# Patient Record
Sex: Female | Born: 1965
Health system: Southern US, Community
[De-identification: ages and names within clinical notes are randomized; demographics above are authoritative.]

## PROBLEM LIST (undated history)

## (undated) DIAGNOSIS — C569 Malignant neoplasm of unspecified ovary: Secondary | ICD-10-CM

## (undated) DIAGNOSIS — Z803 Family history of malignant neoplasm of breast: Secondary | ICD-10-CM

## (undated) DIAGNOSIS — G43909 Migraine, unspecified, not intractable, without status migrainosus: Secondary | ICD-10-CM

## (undated) HISTORY — DX: Family history of malignant neoplasm of breast: Z80.3

## (undated) HISTORY — PX: TOTAL ABDOMINAL HYSTERECTOMY W/ BILATERAL SALPINGOOPHORECTOMY: SHX83

## (undated) HISTORY — DX: Malignant neoplasm of unspecified ovary: C56.9

## (undated) HISTORY — DX: Migraine, unspecified, not intractable, without status migrainosus: G43.909

---

## 2016-08-19 ENCOUNTER — Encounter: Payer: Self-pay | Admitting: Certified Nurse Midwife

## 2016-08-19 ENCOUNTER — Ambulatory Visit: Payer: Self-pay | Admitting: Certified Nurse Midwife

## 2016-08-19 ENCOUNTER — Telehealth: Payer: Self-pay | Admitting: *Deleted

## 2016-08-19 VITALS — BP 112/74 | HR 68 | Resp 16 | Ht 62.25 in | Wt 132.0 lb

## 2016-08-19 DIAGNOSIS — N852 Hypertrophy of uterus: Secondary | ICD-10-CM | POA: Diagnosis not present

## 2016-08-19 DIAGNOSIS — N898 Other specified noninflammatory disorders of vagina: Secondary | ICD-10-CM

## 2016-08-19 DIAGNOSIS — B373 Candidiasis of vulva and vagina: Secondary | ICD-10-CM

## 2016-08-19 DIAGNOSIS — B3731 Acute candidiasis of vulva and vagina: Secondary | ICD-10-CM

## 2016-08-19 LAB — POCT URINALYSIS DIPSTICK
Bilirubin, UA: NEGATIVE
Blood, UA: NEGATIVE
Glucose, UA: NEGATIVE
Ketones, UA: NEGATIVE
LEUKOCYTES UA: NEGATIVE
NITRITE UA: NEGATIVE
PH UA: 5
PROTEIN UA: NEGATIVE
Urobilinogen, UA: NEGATIVE

## 2016-08-19 MED ORDER — TERCONAZOLE 0.4 % VA CREA: 1.0000 | TOPICAL_CREAM | Freq: Every day | VAGINAL | 0 refills | Status: DC

## 2016-08-19 NOTE — Patient Instructions (Signed)

## 2016-08-19 NOTE — Progress Notes (Signed)
51 y.o. Married [place] female G2P0 here with complaint of vaginal symptoms of increase vaginal discharge only in the morning and no odor noted. No itching. Describes discharge as white, noted before and after period, continues today. Daughter with her for translating and agreeable to presence for exam and management. Declines translator or phone line. Onset of symptoms 3 days ago. Denies new personal products or vaginal dryness.Periods normal, monthly. Not heavy, with 5 day duration. No  STD concerns. Urinary symptoms none . Contraception is abstinence  ROS Pertinient to HPI  O:Healthy female WDWN Affect: normal, orientation x 3  Exam:Skin warm and dry Abdomen:soft, non tender  Inguinal Lymph nodes: no enlargement or tenderness Pelvic exam: External genital: normal female BUS: negative Vagina: white cottage cheese appearance non odorous discharge noted. Ph:  4.0  ,Wet prep taken,  Cervix: normal, non tender, no CMT Uterus: enlarged 10-12 week size, non tender, tilts to left Adnexa:normal, non tender, no masses or fullness noted Left adnexa hard to palpate due to uterus tilts to left   Wet Prep results: +yeast with KOH,Saline    A:Normal pelvic exam Yeast vaginitis Enlarged Uterus, not symptomatic   P:Discussed findings of yeast vaginitis and etiology. Discussed  baking soda sitz bath for comfort. Avoid moist clothes or pads for extended period of time Rx: Terazol 7 see order with instructions Discussed uterine findings and need for evaluation. Possible etiology fibroid, adenomyosis or mass. Will need PUS evaluation. Patient wants aex with pap smear and to do PUS same day. Will be leaving the country to care for sick father in 3 weeks. Discussed will try to work with her with this. She will be called with insurance infor and PUS scheduled same as aex scheduled.  Rv prn

## 2016-08-19 NOTE — Telephone Encounter (Signed)
Spoke with Melvia Heaps, CNM- please schedule patient for PUS with Dr. Talbert Nan for enlarged uterus on same day as AEX 08/27/16.  Patient scheduled for PUS 08/27/16 at 2:30pm with 2:45pm consult with Dr. Talbert Nan.   Call to patient to notify of PUS, no answer.   Cc: Dr. Talbert Nan, Lerry Liner

## 2016-08-20 NOTE — Telephone Encounter (Signed)
Spoke with patient's daughter Laura Washington at time of incoming call, okay per ROI. Laura Washington is calling to verify appointment for patient's PUS. Advised appointment has been scheduled for PUS on 08/27/2016 at 2:30 pm with 2:45 pm consult with Dr.Jertson. She will have aex to follow at 3:15 pm with Melvia Heaps CNM (times per [name], RN). Laura Washington is agreeable and will advise patient of appointment date and times. Order has been placed for precert.  Cc: Dr.Jertson  Routing to provider for final review. Patient agreeable to disposition. Will close encounter.

## 2016-08-22 NOTE — Progress Notes (Signed)
Encounter reviewed Janai Brannigan, MD   

## 2016-08-24 ENCOUNTER — Telehealth: Payer: Self-pay | Admitting: Obstetrics and Gynecology

## 2016-08-24 NOTE — Telephone Encounter (Signed)
Called patient to review benefits for a recommended ultrasound. Left Voicemail requesting a call back. °

## 2016-08-27 ENCOUNTER — Encounter: Payer: Self-pay | Admitting: Obstetrics and Gynecology

## 2016-08-27 ENCOUNTER — Encounter: Payer: BLUE CROSS/BLUE SHIELD | Admitting: Certified Nurse Midwife

## 2016-08-27 ENCOUNTER — Encounter: Payer: Self-pay | Admitting: *Deleted

## 2016-08-27 ENCOUNTER — Ambulatory Visit: Payer: BLUE CROSS/BLUE SHIELD | Admitting: Certified Nurse Midwife

## 2016-08-27 ENCOUNTER — Encounter: Payer: Self-pay | Admitting: Certified Nurse Midwife

## 2016-08-27 ENCOUNTER — Ambulatory Visit (INDEPENDENT_AMBULATORY_CARE_PROVIDER_SITE_OTHER): Payer: BLUE CROSS/BLUE SHIELD

## 2016-08-27 ENCOUNTER — Other Ambulatory Visit: Payer: Self-pay

## 2016-08-27 ENCOUNTER — Ambulatory Visit (INDEPENDENT_AMBULATORY_CARE_PROVIDER_SITE_OTHER): Payer: BLUE CROSS/BLUE SHIELD | Admitting: Obstetrics and Gynecology

## 2016-08-27 VITALS — BP 120/68 | HR 88 | Resp 14 | Wt 132.0 lb

## 2016-08-27 DIAGNOSIS — N949 Unspecified condition associated with female genital organs and menstrual cycle: Secondary | ICD-10-CM | POA: Diagnosis not present

## 2016-08-27 DIAGNOSIS — N9489 Other specified conditions associated with female genital organs and menstrual cycle: Secondary | ICD-10-CM

## 2016-08-27 DIAGNOSIS — N852 Hypertrophy of uterus: Secondary | ICD-10-CM | POA: Diagnosis not present

## 2016-08-27 NOTE — Progress Notes (Signed)
Referral placed to [facility]. Demographics, insurance information, OV note and PUS from 08/27/2016 faxed with cover sheet and confirmation to [phone]. Scheduled patient while in office for appointment with Dr.Brewster at [facility] [address], [place] [address] for first available appointment on 09/04/2016 at 9:45 am. Patient is agreeable to date and time.

## 2016-08-27 NOTE — Progress Notes (Signed)
GYNECOLOGY  VISIT   HPI: 51 y.o.   Married  [place]  female  She is here with an interpreter and a family friend.  G2P0 with Patient's last menstrual period was 08/09/2016.   here for follow up enlarged uterus. The patient was seen by Ms Hollice Espy last week with c/o vaginal discharge and was noted to have an "enlarged uterus". She reports normal monthly cycles. Denies abdominal pain or bowel issues. "Feels fine".  GYNECOLOGIC HISTORY: Patient's last menstrual period was 08/09/2016. Contraception: None Menopausal hormone therapy: none         OB History    Gravida Para Term Preterm AB Living   2         2   SAB TAB Ectopic Multiple Live Births                     There are no active problems to display for this patient.   Past Medical History:  Diagnosis Date  . Migraines     No past surgical history on file.  Current Outpatient Prescriptions  Medication Sig Dispense Refill  . Cholecalciferol (VITAMIN D) 2000 units tablet Take one tablet twice daily.    . Ibuprofen (ADVIL PO) Take by mouth as needed.     No current facility-administered medications for this visit.      ALLERGIES: Hydrocodone-acetaminophen  No family history on file.  Social History   Social History  . Marital status: Married    Spouse name: N/A  . Number of children: N/A  . Years of education: N/A   Occupational History  . Not on file.   Social History Main Topics  . Smoking status: Never Smoker  . Smokeless tobacco: Never Used  . Alcohol use No  . Drug use: No  . Sexual activity: Yes    Partners: Male    Birth control/ protection: None   Other Topics Concern  . Not on file   Social History Narrative  . No narrative on file    Review of Systems  Constitutional: Negative.   HENT: Negative.   Eyes: Negative.   Respiratory: Negative.   Cardiovascular: Negative.   Gastrointestinal: Negative.   Genitourinary:       Vaginal discharge  Musculoskeletal: Negative.   Skin: Negative.    Neurological: Negative.   Endo/Heme/Allergies: Negative.   Psychiatric/Behavioral: Negative.     PHYSICAL EXAMINATION:    BP 120/68 (BP Location: Right Arm, Patient Position: Sitting, Cuff Size: Normal)   Pulse 88   Resp 14   LMP 08/09/2016     Abdomen: soft, non-tender; no masses, mildly distended.   Ultrasound images reviewed: The patient's ultrasound is significant for a 7.4 x 6.4 x 6.6 cm solid mass in the left adnexa with irregular borders and blood flow. The Pulsatile index is <1 and the resistive index is <0.4. There is a large amount of pelvic fluid. Concerning for possible ovarian cancer.   ASSESSMENT Complex left adnexal mass concerning for possible maliganacy    PLAN CA 125 Will set her up to see a GYN Oncologist The patient is supposed to leave next week for a 5 week trip to [place]. I advised her not to travel, I feel she needs to get evaluated and treated   An After Visit Summary was printed and given to the patient.  20 minutes face to face time of which over 50% was spent in counseling.

## 2016-08-27 NOTE — Progress Notes (Signed)
Pt had u/s done before visit with abnormal findings. Annual exam defferred at this time. Pt to have follow up with Dr Talbert Nan with results and management to follow.

## 2016-08-28 ENCOUNTER — Telehealth: Payer: Self-pay | Admitting: *Deleted

## 2016-08-28 LAB — CA 125: CA 125: 1677 U/mL — AB (ref <35)

## 2016-08-28 NOTE — Telephone Encounter (Signed)
-----   Message from Salvadore Dom, MD sent at 08/28/2016  3:12 PM EST ----- Please inform the patient that her blood test was abnormal, which is not unexpected. Please send a copy to the GYN oncology clinic.  The patient doesn't speak English well, there are other names on her DPR that do.

## 2016-08-28 NOTE — Telephone Encounter (Signed)
ROI reviewed. Can leave message on [phone]. This is also daughter's number and can release info to daughter. Left message to call back to Dr Gentry Fitz office. Ask for Gay Filler or [place].

## 2016-08-28 NOTE — Telephone Encounter (Signed)
Return call from patient's daughter Sumeer. Demographics confirmed and she is able to provide clinical summary of visit yesterday visit.  Advised we are calling to notify Jaydee of results of blood test yesterday are abnormal and it is important to keep appointment for next week as scheduled. Sumeer asked if this provided any other information, advised that the abnormal result is consistent with ultrasound concerns from yesterday and follow-up appointment is needed to determine next step. Daughter states they will keep appointment.  Encounter closed.

## 2016-09-17 ENCOUNTER — Other Ambulatory Visit: Payer: Self-pay | Admitting: Gynecologic Oncology

## 2016-09-17 DIAGNOSIS — C5702 Malignant neoplasm of left fallopian tube: Secondary | ICD-10-CM

## 2016-09-23 ENCOUNTER — Encounter: Payer: Self-pay | Admitting: Hematology and Oncology

## 2016-09-23 ENCOUNTER — Ambulatory Visit (HOSPITAL_BASED_OUTPATIENT_CLINIC_OR_DEPARTMENT_OTHER): Payer: BLUE CROSS/BLUE SHIELD | Admitting: Hematology and Oncology

## 2016-09-23 DIAGNOSIS — G8929 Other chronic pain: Secondary | ICD-10-CM

## 2016-09-23 DIAGNOSIS — M25552 Pain in left hip: Secondary | ICD-10-CM

## 2016-09-23 DIAGNOSIS — C5702 Malignant neoplasm of left fallopian tube: Secondary | ICD-10-CM | POA: Diagnosis not present

## 2016-09-25 DIAGNOSIS — M25552 Pain in left hip: Secondary | ICD-10-CM

## 2016-09-25 DIAGNOSIS — G8929 Other chronic pain: Secondary | ICD-10-CM | POA: Insufficient documentation

## 2016-09-25 NOTE — Assessment & Plan Note (Signed)
She had chronic left hip pain and has been referred to orthopedic surgery for management. The left pain actually has improved somewhat since surgery. I suspect she may have referred pain from her pelvis at the time of diagnosis. She will continue conservative management

## 2016-09-25 NOTE — Progress Notes (Signed)
[facility] CONSULT NOTE  Patient Care Team: No Pcp Per Patient as PCP - General (General Practice)  CHIEF COMPLAINTS/PURPOSE OF CONSULTATION:  Fallopian cancer status post surgery  HISTORY OF PRESENTING ILLNESS:  Laura Washington 51 y.o. female is here because of recent diagnosis of cancer. An interpreter is present. A family friend who identified as a close kin is also present She is a patient who was recently diagnosed with fallopian cancer.  Her symptoms began with left hip pain.  She saw her primary doctor who referred her to see orthopedic surgeon.  She also presented with chronic menorrhagia for over a year and subsequently had new onset of vaginal discharge after she was started on some medications for arthritis.  I have reviewed her chart extensively as the patient is seen in multiple health system.  Summary of her oncologic history is as follows:   Cancer of left fallopian tube ([place])   07/28/2016 Initial Diagnosis    She presented to PCP complaining of heavy menstruation for almost 1 year      08/27/2016 Imaging    She was referred to see Dr. Talbert Nan after she started to have vaginal discharge. Pelvic US showed: 7.4 x 6.4 x 6.6 cm solid mass in the left adnexa with irregular borders and blood flow. The Pulsatile index is <1 and the resistive index is <0.4. There is a large amount of pelvic fluid. Concerning for possible ovarian cancer.        08/27/2016 Tumor Marker    Patient's tumor was tested for the following markers: CA125. Results of the tumor marker test revealed 1577      09/09/2016 Pathology Results    Primary Tumor Site(s):Left fallopian tube Tumor Size:Greatest dimension (cm): 3.5 cm Histologic Type:Serous high-grade carcinoma Histologic Grade:G3: Poorly differentiated  Ovarian Surface Involvement:Present  Specimen(s):Right ovary  Right Ovary - Extent:Not involved  Specimen(s):Left ovary  Left  Ovary - Extent:Involved  Specimen(s):Right fallopian tube  Right Fallopian Tube - Extent:Not involved  Specimen(s):Left fallopian tube  Left Fallopian Tube - Extent:Involved  Specimen(s):Omentum  Omentum - Extent:Not involved  Specimen(s):Uterus  Uterus - Extent:Not involved  Specimen(s):Cervix  Cervix - Extent:Not involved  Specimen(s):Peritoneum  Peritoneum - Extent:Not involved  Peritoneal Ascitic Fluid:Negative for malignancy (normal / benign)  Pleural Fluid:Not performed / unknown  LYMPH NODES  Pelvic Lymph Nodes:  Number of Pelvic Lymph Nodes Examined:Specify number: 17  Number of Pelvic Lymph Nodes Involved:None identified  Para-aortic Lymph Nodes:  Number of Para-aortic Lymph Nodes Examined:Specify number: 2  Number of Para-aortic Lymph Nodes Involved:None identified  STAGE (pTNM, AJCC 7th ed.)  Primary Tumor (pT):Fallopian tube  Fallopian Tube Primary Tumor (pT):pT2a: Extension and / or metastasis to the uterus and / or ovaries  Regional Lymph Nodes (pN):pN0: No regional lymph node metastasis  Distant Metastasis (pM):Not applicable - pM cannot be determined from the submitted specimen(s)  FIGO STAGE (FIGO 2014)  FIGO Stage:IIA: Extension and / or implants on the uterus and / or fallopian tubes and / or ovaries      09/09/2016 Pathology Results    Pelvic fluid, washing: No definitive malignant cells identified. Blood, histiocytes, and reactive mesothelial cells present. Mixed inflammation present. See comment.      09/09/2016 Surgery    Procedures: Bilateral - Robotic Laparoscopy, Surgical; With Bilateral Total Pelvic Lymphadenectomy Peri-Aortic Lymph Node Sample, Sgl/Mult Robotic Bilateral Salpingo-Oophorectomy W/Omentectomy, Total Abd Hysterectomy & Radical Dissection  For Debulking;    Findings:  1. 9-10 cm left adnexal mass with rupture  of tumor during manipulation 2. Dilated left fallopian tube removed in tact with IOFS: poorly differentiated carcinoma 3. Endometriosis implants on anterior and posterior cul-de-sac 4. Grossly normal omentum, diaphragm, small bowel mesenter 5. Grossly normal uterus and right fallopian tube/ovary 6. IOFS: anterior peritoneal biopsy (endometriosis) // left fallopian tube: poorly differentiated carcinoma 7. 1-2 cm tumor nodule in posterior cul-de-sac; completely resected 8. Non-enlarged lymph nodes 9. R0 resection at end of case 10. Ascites present      She is doing well since surgery.  She has mild incisional pain and occasional headache.  She also have mild gassy/crampy abdominal pain but had regular bowel movement since surgery. She had very mild oozing from her surgery site but since then has healed well She denies further vaginal discharge since surgery. The left hip pain has improved   MEDICAL HISTORY:  Past Medical History:  Diagnosis Date  . Migraines   . Ovarian cancer [facility])     SURGICAL HISTORY: Past Surgical History:  Procedure Laterality Date  . TOTAL ABDOMINAL HYSTERECTOMY W/ BILATERAL SALPINGOOPHORECTOMY      SOCIAL HISTORY: Social History   Social History  . Marital status: Married    Spouse name: N/A  . Number of children: 2  . Years of education: N/A   Occupational History  . packing socks    Social History Main Topics  . Smoking status: Never Smoker  . Smokeless tobacco: Never Used  . Alcohol use No  . Drug use: No  . Sexual activity: Yes    Partners: Male    Birth control/ protection: None   Other Topics Concern  . Not on file   Social History Narrative  . No narrative on file    FAMILY HISTORY: History reviewed. No pertinent family history.  ALLERGIES:  is allergic to hydrocodone-acetaminophen.  MEDICATIONS:  Current Outpatient Prescriptions  Medication Sig  Dispense Refill  . Ibuprofen (ADVIL PO) Take by mouth as needed.    . simethicone (MYLICON) 175 MG chewable tablet Chew 125 mg by mouth every 6 (six) hours as needed for flatulence.    . Cholecalciferol (VITAMIN D) 2000 units tablet Take one tablet twice daily.    Marland Kitchen docusate sodium (COLACE) 100 MG capsule Take 100 mg by mouth.     No current facility-administered medications for this visit.     REVIEW OF SYSTEMS:   Constitutional: Denies fevers, chills or abnormal night sweats Eyes: Denies blurriness of vision, double vision or watery eyes Ears, nose, mouth, throat, and face: Denies mucositis or sore throat Respiratory: Denies cough, dyspnea or wheezes Cardiovascular: Denies palpitation, chest discomfort or lower extremity swelling Gastrointestinal:  Denies nausea, heartburn or change in bowel habits Skin: Denies abnormal skin rashes Lymphatics: Denies new lymphadenopathy or easy bruising Neurological:Denies numbness, tingling or new weaknesses Behavioral/Psych: Mood is stable, no new changes  All other systems were reviewed with the patient and are negative.  PHYSICAL EXAMINATION: ECOG PERFORMANCE STATUS: 1 - Symptomatic but completely ambulatory  Vitals:   09/23/16 1351  BP: (!) 146/79  Pulse: (!) 108  Resp: 16  Temp: 97.8 F (36.6 C)   Filed Weights   09/23/16 1351  Weight: 123 lb 4.8 oz (55.9 kg)    GENERAL:alert, no distress and comfortable SKIN: skin color, texture, turgor are normal, no rashes or significant lesions EYES: normal, conjunctiva are pink and non-injected, sclera clear OROPHARYNX:no exudate, no erythema and lips, buccal mucosa, and tongue normal  NECK: supple, thyroid normal size, non-tender, without nodularity LYMPH:  no  palpable lymphadenopathy in the cervical, axillary or inguinal LUNGS: clear to auscultation and percussion with normal breathing effort HEART: regular rate & rhythm and no murmurs and no lower extremity edema ABDOMEN:abdomen soft,  non-tender and normal bowel sounds.  Well-healed surgical scar Musculoskeletal:no cyanosis of digits and no clubbing  PSYCH: alert & oriented x 3 with fluent speech NEURO: no focal motor/sensory deficits  LABORATORY DATA:  I have reviewed the outside labs  ASSESSMENT & PLAN:  Cancer of left fallopian tube ([place]) I spent a significant amount of time discussing with the patient and family the role of adjuvant treatment. I also reviewed the current guidelines. Given the high-grade pathology, it is highly recommend that she receive adjuvant treatment to prevent recurrence of cancer. We discussed the risks, benefits, side effects of adjuvant treatment with carboplatin and paclitaxel, including risk of pancytopenia, allergic reaction, risk of infection, neuropathy, etc. We also discussed about potential port placement I also discussed baseline staging CT scan before we proceed with chemotherapy The patient has a lot of reservations about chemotherapy.  She felt weak overall and is very sensitive to side effects of medications. She did not feel that she can tolerate it. I tried to reassure the patient and family.  She is young without major significant medical comorbidities.  I suspect she would do well with treatment. Her family member desire her to obtain second opinion. I discussed with them the collaborated practice between [facility] and [facility].  She has a son who lives near [place] area and they have consider second opinion at [facility]. In addition to adjuvant treatment, given her young age at diagnosis, I also recommend consideration for genetic counseling. Ultimately, she has appointment at the end of the week to discuss further with her surgeon about the role of adjuvant treatment. I have not made appointment for the patient to come back  Chronic hip pain, left She had chronic left hip pain and has been referred to orthopedic surgery for  management. The left pain actually has improved somewhat since surgery. I suspect she may have referred pain from her pelvis at the time of diagnosis. She will continue conservative management    All questions were answered. The patient knows to call the clinic with any problems, questions or concerns. I spent 55 minutes counseling the patient face to face. The total time spent in the appointment was 60 minutes and more than 50% was on counseling.     Heath Lark, MD 09/25/2016 6:11 AM

## 2016-09-25 NOTE — Assessment & Plan Note (Addendum)
I spent a significant amount of time discussing with the patient and family the role of adjuvant treatment. I also reviewed the current guidelines. Given the high-grade pathology, it is highly recommend that she receive adjuvant treatment to prevent recurrence of cancer. We discussed the risks, benefits, side effects of adjuvant treatment with carboplatin and paclitaxel, including risk of pancytopenia, allergic reaction, risk of infection, neuropathy, etc. We also discussed about potential port placement I also discussed baseline staging CT scan before we proceed with chemotherapy The patient has a lot of reservations about chemotherapy.  She felt weak overall and is very sensitive to side effects of medications. She did not feel that she can tolerate it. I tried to reassure the patient and family.  She is young without major significant medical comorbidities.  I suspect she would do well with treatment. Her family member desire her to obtain second opinion. I discussed with them the collaborated practice between [facility] and [facility].  She has a son who lives near [place] area and they have consider second opinion at [facility]. In addition to adjuvant treatment, given her young age at diagnosis, I also recommend consideration for genetic counseling. Ultimately, she has appointment at the end of the week to discuss further with her surgeon about the role of adjuvant treatment. I have not made appointment for the patient to come back

## 2016-11-09 ENCOUNTER — Telehealth: Payer: Self-pay

## 2016-11-09 NOTE — Telephone Encounter (Signed)
I will put scheduling msg to see her back next week

## 2016-11-09 NOTE — Telephone Encounter (Signed)
Son called and left message that his Mom needs a appointment with Dr. Alvy Bimler. Because of the language barrier it would be best to call him regarding appointment at [phone].

## 2016-11-11 ENCOUNTER — Telehealth: Payer: Self-pay | Admitting: Hematology and Oncology

## 2016-11-11 NOTE — Telephone Encounter (Signed)
lvm to inform pt of 5/9 appt at 3 pm per LOS

## 2016-11-18 ENCOUNTER — Ambulatory Visit (HOSPITAL_BASED_OUTPATIENT_CLINIC_OR_DEPARTMENT_OTHER): Payer: BLUE CROSS/BLUE SHIELD | Admitting: Hematology and Oncology

## 2016-11-18 ENCOUNTER — Telehealth: Payer: Self-pay

## 2016-11-18 ENCOUNTER — Telehealth: Payer: Self-pay | Admitting: Hematology and Oncology

## 2016-11-18 ENCOUNTER — Encounter: Payer: Self-pay | Admitting: Hematology and Oncology

## 2016-11-18 VITALS — BP 140/70 | HR 105 | Temp 97.7°F | Resp 18 | Ht 62.25 in | Wt 123.5 lb

## 2016-11-18 DIAGNOSIS — C5702 Malignant neoplasm of left fallopian tube: Secondary | ICD-10-CM

## 2016-11-18 NOTE — Assessment & Plan Note (Signed)
I spent a significant amount of time discussing with the patient and family the role of adjuvant treatment. I also reviewed the current guidelines. Given the high-grade pathology, it is highly recommend that she receive adjuvant treatment to prevent recurrence of cancer. We discussed the risks, benefits, side effects of adjuvant treatment with carboplatin and paclitaxel, including risk of pancytopenia, allergic reaction, risk of infection, neuropathy, etc. We also discussed about potential port placement I also discussed baseline staging CT scan before we proceed with chemotherapy We also discussed genetic counseling and genetic testing ultimately, she is in agreement to proceed with adjuvant treatment. I will order CT scan, chemo education class, referral for genetic counseling/testing, blood work, see me back around Dec 02, 2016 for chemotherapy consent and to start cycle 1 on 12/04/2016. She would need total of 6 cycles of treatment. I recommend consideration to apply for disability during treatment I will see her back again to review all test results.

## 2016-11-18 NOTE — Telephone Encounter (Signed)
-----   Message from Loletta Parish sent at 11/18/2016  4:06 PM EDT ----- Regarding: RE: Ct scan to be done in 2 weeks Authorizations Approved.  Thanks  ----- Message ----- From: Heath Lark, MD Sent: 11/18/2016   3:36 PM To: Flo Shanks, RN, Loletta Parish Subject: Ct scan to be done in 2 weeks                  FYI: need CT done

## 2016-11-18 NOTE — Progress Notes (Signed)
[facility] OFFICE PROGRESS NOTE  Patient Care Team: Patient, No Pcp Per as PCP - General (General Practice)  SUMMARY OF ONCOLOGIC HISTORY:   Cancer of left fallopian tube ([place])   07/28/2016 Initial Diagnosis    She presented to PCP complaining of heavy menstruation for almost 1 year      08/27/2016 Imaging    She was referred to see Dr. Talbert Nan after she started to have vaginal discharge. Pelvic US showed: 7.4 x 6.4 x 6.6 cm solid mass in the left adnexa with irregular borders and blood flow. The Pulsatile index is <1 and the resistive index is <0.4. There is a large amount of pelvic fluid. Concerning for possible ovarian cancer.        08/27/2016 Tumor Marker    Patient's tumor was tested for the following markers: CA125. Results of the tumor marker test revealed 1577      09/09/2016 Pathology Results    Primary Tumor Site(s):Left fallopian tube Tumor Size:Greatest dimension (cm): 3.5 cm Histologic Type:Serous high-grade carcinoma Histologic Grade:G3: Poorly differentiated  Ovarian Surface Involvement:Present  Specimen(s):Right ovary  Right Ovary - Extent:Not involved  Specimen(s):Left ovary  Left Ovary - Extent:Involved  Specimen(s):Right fallopian tube  Right Fallopian Tube - Extent:Not involved  Specimen(s):Left fallopian tube  Left Fallopian Tube - Extent:Involved  Specimen(s):Omentum  Omentum - Extent:Not involved  Specimen(s):Uterus  Uterus - Extent:Not involved  Specimen(s):Cervix  Cervix - Extent:Not involved  Specimen(s):Peritoneum  Peritoneum - Extent:Not involved  Peritoneal Ascitic Fluid:Negative for malignancy (normal / benign)  Pleural Fluid:Not performed / unknown  LYMPH NODES  Pelvic Lymph Nodes:  Number of Pelvic Lymph Nodes Examined:Specify  number: 17  Number of Pelvic Lymph Nodes Involved:None identified  Para-aortic Lymph Nodes:  Number of Para-aortic Lymph Nodes Examined:Specify number: 2  Number of Para-aortic Lymph Nodes Involved:None identified  STAGE (pTNM, AJCC 7th ed.)  Primary Tumor (pT):Fallopian tube  Fallopian Tube Primary Tumor (pT):pT2a: Extension and / or metastasis to the uterus and / or ovaries  Regional Lymph Nodes (pN):pN0: No regional lymph node metastasis  Distant Metastasis (pM):Not applicable - pM cannot be determined from the submitted specimen(s)  FIGO STAGE (FIGO 2014)  FIGO Stage:IIA: Extension and / or implants on the uterus and / or fallopian tubes and / or ovaries      09/09/2016 Pathology Results    Pelvic fluid, washing: No definitive malignant cells identified. Blood, histiocytes, and reactive mesothelial cells present. Mixed inflammation present. See comment.      09/09/2016 Surgery    Procedures: Bilateral - Robotic Laparoscopy, Surgical; With Bilateral Total Pelvic Lymphadenectomy Peri-Aortic Lymph Node Sample, Sgl/Mult Robotic Bilateral Salpingo-Oophorectomy W/Omentectomy, Total Abd Hysterectomy & Radical Dissection For Debulking;    Findings:  1. 9-10 cm left adnexal mass with rupture of tumor during manipulation 2. Dilated left fallopian tube removed in tact with IOFS: poorly differentiated carcinoma 3. Endometriosis implants on anterior and posterior cul-de-sac 4. Grossly normal omentum, diaphragm, small bowel mesenter 5. Grossly normal uterus and right fallopian tube/ovary 6. IOFS: anterior peritoneal biopsy (endometriosis) // left fallopian tube: poorly differentiated carcinoma 7. 1-2 cm tumor nodule in posterior cul-de-sac; completely resected 8. Non-enlarged lymph nodes 9. R0 resection at end of case 10. Ascites present       INTERVAL HISTORY: Please see below for problem oriented charting. She returns with  her son today. Her son has convinced her to start adjuvant treatment. Her son has acted as an Astronomer during the visit She is recovering well from recent surgery.  She continues to have mild hip pain, stable.  REVIEW OF SYSTEMS:   Constitutional: Denies fevers, chills or abnormal weight loss Eyes: Denies blurriness of vision Ears, nose, mouth, throat, and face: Denies mucositis or sore throat Respiratory: Denies cough, dyspnea or wheezes Cardiovascular: Denies palpitation, chest discomfort or lower extremity swelling Gastrointestinal:  Denies nausea, heartburn or change in bowel habits Skin: Denies abnormal skin rashes Lymphatics: Denies new lymphadenopathy or easy bruising Neurological:Denies numbness, tingling or new weaknesses Behavioral/Psych: Mood is stable, no new changes  All other systems were reviewed with the patient and are negative.  I have reviewed the past medical history, past surgical history, social history and family history with the patient and they are unchanged from previous note.  ALLERGIES:  is allergic to hydrocodone-acetaminophen.  MEDICATIONS:  Current Outpatient Prescriptions  Medication Sig Dispense Refill  . Cholecalciferol (VITAMIN D) 2000 units tablet Take one tablet twice daily.    . Ibuprofen (ADVIL PO) Take by mouth as needed.    . simethicone (MYLICON) 062 MG chewable tablet Chew 125 mg by mouth every 6 (six) hours as needed for flatulence.     No current facility-administered medications for this visit.     PHYSICAL EXAMINATION: ECOG PERFORMANCE STATUS: 0 - Asymptomatic  Vitals:   11/18/16 1501  BP: 140/70  Pulse: (!) 105  Resp: 18  Temp: 97.7 F (36.5 C)   Filed Weights   11/18/16 1501  Weight: 123 lb 8 oz (56 kg)    GENERAL:alert, no distress and comfortable SKIN: skin color, texture, turgor are normal, no rashes or significant lesions EYES: normal, Conjunctiva are pink and non-injected, sclera clear OROPHARYNX:no exudate,  no erythema and lips, buccal mucosa, and tongue normal  NECK: supple, thyroid normal size, non-tender, without nodularity LYMPH:  no palpable lymphadenopathy in the cervical, axillary or inguinal LUNGS: clear to auscultation and percussion with normal breathing effort HEART: regular rate & rhythm and no murmurs and no lower extremity edema ABDOMEN:abdomen soft, non-tender and normal bowel sounds Musculoskeletal:no cyanosis of digits and no clubbing  NEURO: alert & oriented x 3 with fluent speech, no focal motor/sensory deficits  LABORATORY DATA:  I have reviewed the data as listed No results found for: NA, K, CL, CO2, GLUCOSE, BUN, CREATININE, CALCIUM, PROT, ALBUMIN, AST, ALT, ALKPHOS, BILITOT, GFRNONAA, GFRAA  No results found for: SPEP, UPEP  No results found for: WBC, NEUTROABS, HGB, HCT, MCV, PLT    Chemistry   No results found for: NA, K, CL, CO2, BUN, CREATININE, GLU No results found for: CALCIUM, ALKPHOS, AST, ALT, BILITOT     ASSESSMENT & PLAN:  Cancer of left fallopian tube ([facility]) I spent a significant amount of time discussing with the patient and family the role of adjuvant treatment. I also reviewed the current guidelines. Given the high-grade pathology, it is highly recommend that she receive adjuvant treatment to prevent recurrence of cancer. We discussed the risks, benefits, side effects of adjuvant treatment with carboplatin and paclitaxel, including risk of pancytopenia, allergic reaction, risk of infection, neuropathy, etc. We also discussed about potential port placement I also discussed baseline staging CT scan before we proceed with chemotherapy We also discussed genetic counseling and genetic testing ultimately, she is in agreement to proceed with adjuvant treatment. I will order CT scan, chemo education class, referral for genetic counseling/testing, blood work, see me back around Dec 02, 2016 for chemotherapy consent and to start cycle 1 on 12/04/2016. She would  need total of 6 cycles of treatment. I  recommend consideration to apply for disability during treatment I will see her back again to review all test results.   Orders Placed This Encounter  Procedures  . CT CHEST W CONTRAST    Standing Status:   Future    Standing Expiration Date:   01/18/2018    Order Specific Question:   Reason for Exam (SYMPTOM  OR DIAGNOSIS REQUIRED)    Answer:   staging fallopian tube cancer, exclude recurrence    Order Specific Question:   Is the patient pregnant?    Answer:   No    Order Specific Question:   Preferred imaging location?    Answer:   [facility]  . CT ABDOMEN PELVIS W CONTRAST    Standing Status:   Future    Standing Expiration Date:   02/18/2018    Order Specific Question:   Reason for Exam (SYMPTOM  OR DIAGNOSIS REQUIRED)    Answer:   staging fallopian tube cancer, exclude recurrence    Order Specific Question:   Is the patient pregnant?    Answer:   No    Order Specific Question:   Preferred imaging location?    Answer:   [facility]  . IR FLUORO GUIDE PORT INSERTION RIGHT    Standing Status:   Future    Standing Expiration Date:   01/18/2018    Order Specific Question:   Reason for Exam (SYMPTOM  OR DIAGNOSIS REQUIRED)    Answer:   need port for chemo    Order Specific Question:   Is the patient pregnant?    Answer:   No    Order Specific Question:   Preferred Imaging Location?    Answer:   [facility]  . CBC with Differential    Standing Status:   Standing    Number of Occurrences:   20    Standing Expiration Date:   11/19/2017  . Comprehensive metabolic panel    Standing Status:   Standing    Number of Occurrences:   20    Standing Expiration Date:   11/19/2017  . CA 125    Standing Status:   Future    Standing Expiration Date:   12/23/2017  . Ambulatory referral to Genetics    Referral Priority:   Routine    Referral Type:   Consultation    Referral Reason:   Specialty Services Required    Number of  Visits Requested:   1   All questions were answered. The patient knows to call the clinic with any problems, questions or concerns. No barriers to learning was detected. I spent 40 minutes counseling the patient face to face. The total time spent in the appointment was 45 minutes and more than 50% was on counseling and review of test results     Heath Lark, MD 11/18/2016 3:54 PM

## 2016-11-18 NOTE — Telephone Encounter (Signed)
Called and given below message. Scheduler will call patient.

## 2016-11-18 NOTE — Telephone Encounter (Signed)
Scheduled appt per 5/9 los. Gave patient  AVS and calender per 5/9 los.

## 2016-11-18 NOTE — Progress Notes (Signed)
START ON PATHWAY REGIMEN - Ovarian     A cycle is every 21 days:     Paclitaxel      Carboplatin   **Always confirm dose/schedule in your pharmacy ordering system**    Patient Characteristics: First Line Therapy, No Previous Neoadjuvant Therapy, Stage IIA AJCC T Category: T2 AJCC N Category: N0 AJCC M Category: M0 AJCC 8 Stage Grouping: II Line of Therapy: First Line Therapy BRCA Mutation Status: Awaiting Test Results Would you be surprised if this patient died  in the next year? I would be surprised if this patient died in the next year Previous Neoadjuvant Therapy? No  Intent of Therapy: Curative Intent, Discussed with Patient

## 2016-11-29 ENCOUNTER — Other Ambulatory Visit: Payer: Self-pay | Admitting: Radiology

## 2016-11-30 ENCOUNTER — Ambulatory Visit (HOSPITAL_COMMUNITY)
Admission: RE | Admit: 2016-11-30 | Discharge: 2016-11-30 | Disposition: A | Discharge status: Home / Self Care | Payer: BLUE CROSS/BLUE SHIELD | Source: Ambulatory Visit | Attending: Hematology and Oncology | Admitting: Hematology and Oncology

## 2016-11-30 ENCOUNTER — Encounter (HOSPITAL_COMMUNITY): Payer: Self-pay

## 2016-11-30 DIAGNOSIS — Z90722 Acquired absence of ovaries, bilateral: Secondary | ICD-10-CM | POA: Insufficient documentation

## 2016-11-30 DIAGNOSIS — Z90710 Acquired absence of both cervix and uterus: Secondary | ICD-10-CM | POA: Insufficient documentation

## 2016-11-30 DIAGNOSIS — C5702 Malignant neoplasm of left fallopian tube: Secondary | ICD-10-CM | POA: Diagnosis present

## 2016-11-30 DIAGNOSIS — Z803 Family history of malignant neoplasm of breast: Secondary | ICD-10-CM | POA: Diagnosis not present

## 2016-11-30 DIAGNOSIS — Z885 Allergy status to narcotic agent status: Secondary | ICD-10-CM | POA: Diagnosis not present

## 2016-11-30 DIAGNOSIS — Z8249 Family history of ischemic heart disease and other diseases of the circulatory system: Secondary | ICD-10-CM | POA: Diagnosis not present

## 2016-11-30 MED ORDER — IOPAMIDOL (ISOVUE-300) INJECTION 61%
INTRAVENOUS | Status: AC
  Filled 2016-11-30: qty 100

## 2016-11-30 MED ORDER — IOPAMIDOL (ISOVUE-300) INJECTION 61%
100.0000 mL | Freq: Once | INTRAVENOUS | Status: AC | PRN
  Administered 2016-11-30: 100 mL via INTRAVENOUS

## 2016-12-01 ENCOUNTER — Ambulatory Visit (HOSPITAL_COMMUNITY)
Admission: RE | Admit: 2016-12-01 | Discharge: 2016-12-01 | Disposition: A | Discharge status: Home / Self Care | Payer: BLUE CROSS/BLUE SHIELD | Source: Ambulatory Visit | Attending: Hematology and Oncology | Admitting: Hematology and Oncology

## 2016-12-01 ENCOUNTER — Ambulatory Visit (HOSPITAL_BASED_OUTPATIENT_CLINIC_OR_DEPARTMENT_OTHER): Payer: BLUE CROSS/BLUE SHIELD | Admitting: Genetic Counselor

## 2016-12-01 ENCOUNTER — Encounter (HOSPITAL_COMMUNITY): Payer: Self-pay

## 2016-12-01 ENCOUNTER — Other Ambulatory Visit: Payer: Self-pay | Admitting: Hematology and Oncology

## 2016-12-01 ENCOUNTER — Encounter: Payer: Self-pay | Admitting: Genetic Counselor

## 2016-12-01 ENCOUNTER — Other Ambulatory Visit (HOSPITAL_BASED_OUTPATIENT_CLINIC_OR_DEPARTMENT_OTHER): Payer: BLUE CROSS/BLUE SHIELD

## 2016-12-01 DIAGNOSIS — C5702 Malignant neoplasm of left fallopian tube: Secondary | ICD-10-CM

## 2016-12-01 DIAGNOSIS — Z803 Family history of malignant neoplasm of breast: Secondary | ICD-10-CM | POA: Diagnosis not present

## 2016-12-01 DIAGNOSIS — Z7183 Encounter for nonprocreative genetic counseling: Secondary | ICD-10-CM

## 2016-12-01 HISTORY — PX: IR US GUIDE VASC ACCESS RIGHT: IMG2390

## 2016-12-01 HISTORY — PX: IR FLUORO GUIDE PORT INSERTION RIGHT: IMG5741

## 2016-12-01 LAB — BASIC METABOLIC PANEL
Anion gap: 11 (ref 5–15)
BUN: 8 mg/dL (ref 6–20)
CHLORIDE: 103 mmol/L (ref 101–111)
CO2: 26 mmol/L (ref 22–32)
CREATININE: 0.76 mg/dL (ref 0.44–1.00)
Calcium: 9.5 mg/dL (ref 8.9–10.3)
Glucose, Bld: 104 mg/dL — ABNORMAL HIGH (ref 65–99)
Potassium: 3.6 mmol/L (ref 3.5–5.1)
Sodium: 140 mmol/L (ref 135–145)

## 2016-12-01 LAB — CBC WITH DIFFERENTIAL/PLATELET
Basophils Absolute: 0 10*3/uL (ref 0.0–0.1)
Basophils Relative: 0 %
Eosinophils Absolute: 0 10*3/uL (ref 0.0–0.7)
Eosinophils Relative: 0 %
HCT: 33.3 % — ABNORMAL LOW (ref 36.0–46.0)
Hemoglobin: 9.7 g/dL — ABNORMAL LOW (ref 12.0–15.0)
Lymphocytes Relative: 24 %
Lymphs Abs: 1.4 10*3/uL (ref 0.7–4.0)
MCH: 20.9 pg — AB (ref 26.0–34.0)
MCHC: 29.1 g/dL — ABNORMAL LOW (ref 30.0–36.0)
MCV: 71.6 fL — ABNORMAL LOW (ref 78.0–100.0)
MONO ABS: 0.2 10*3/uL (ref 0.1–1.0)
Monocytes Relative: 3 %
NEUTROS PCT: 73 %
Neutro Abs: 4.2 10*3/uL (ref 1.7–7.7)
PLATELETS: 321 10*3/uL (ref 150–400)
RBC: 4.65 MIL/uL (ref 3.87–5.11)
RDW: 16.1 % — AB (ref 11.5–15.5)
WBC: 5.8 10*3/uL (ref 4.0–10.5)

## 2016-12-01 LAB — PROTIME-INR
INR: 0.99
Prothrombin Time: 13.1 seconds (ref 11.4–15.2)

## 2016-12-01 MED ORDER — HEPARIN SOD (PORK) LOCK FLUSH 100 UNIT/ML IV SOLN
INTRAVENOUS | Status: AC
  Filled 2016-12-01: qty 5

## 2016-12-01 MED ORDER — CEFAZOLIN SODIUM-DEXTROSE 2-4 GM/100ML-% IV SOLN
INTRAVENOUS | Status: AC
  Filled 2016-12-01: qty 100

## 2016-12-01 MED ORDER — FENTANYL CITRATE (PF) 100 MCG/2ML IJ SOLN
INTRAMUSCULAR | Status: AC
  Filled 2016-12-01: qty 8

## 2016-12-01 MED ORDER — FENTANYL CITRATE (PF) 100 MCG/2ML IJ SOLN
INTRAMUSCULAR | Status: AC | PRN
  Administered 2016-12-01: 25 ug via INTRAVENOUS
  Administered 2016-12-01: 50 ug via INTRAVENOUS
  Administered 2016-12-01: 25 ug via INTRAVENOUS

## 2016-12-01 MED ORDER — LIDOCAINE HCL 1 % IJ SOLN
INTRAMUSCULAR | Status: AC
  Filled 2016-12-01: qty 20

## 2016-12-01 MED ORDER — LIDOCAINE HCL 1 % IJ SOLN
INTRAMUSCULAR | Status: AC | PRN
  Administered 2016-12-01: 5 mL via INTRADERMAL

## 2016-12-01 MED ORDER — LIDOCAINE-EPINEPHRINE (PF) 2 %-1:200000 IJ SOLN
INTRAMUSCULAR | Status: AC
  Filled 2016-12-01: qty 20

## 2016-12-01 MED ORDER — LIDOCAINE-EPINEPHRINE (PF) 2 %-1:200000 IJ SOLN
INTRAMUSCULAR | Status: AC | PRN
  Administered 2016-12-01: 10 mL via INTRADERMAL

## 2016-12-01 MED ORDER — MIDAZOLAM HCL 2 MG/2ML IJ SOLN
INTRAMUSCULAR | Status: AC
  Filled 2016-12-01: qty 8

## 2016-12-01 MED ORDER — SODIUM CHLORIDE 0.9 % IV SOLN
INTRAVENOUS | Status: DC
  Administered 2016-12-01: 13:00:00 via INTRAVENOUS

## 2016-12-01 MED ORDER — CEFAZOLIN SODIUM-DEXTROSE 2-4 GM/100ML-% IV SOLN
2.0000 g | INTRAVENOUS | Status: AC
  Administered 2016-12-01: 2 g via INTRAVENOUS

## 2016-12-01 MED ORDER — MIDAZOLAM HCL 2 MG/2ML IJ SOLN
INTRAMUSCULAR | Status: AC | PRN
Start: 2016-12-01 — End: 2016-12-01
  Administered 2016-12-01 (×4): 1 mg via INTRAVENOUS

## 2016-12-01 NOTE — Progress Notes (Signed)
REFERRING PROVIDER: Heath Lark, MD [place], [place] [id number]  PRIMARY PROVIDER:  Patient, No Pcp Per  PRIMARY REASON FOR VISIT:  1. Cancer of left fallopian tube ([place])   2. Family history of breast cancer      HISTORY OF PRESENT ILLNESS:   Ms. Pfund, a 51 y.o. female, was seen for a [place] cancer genetics consultation at the request of Dr. Alvy Bimler due to a personal and family history of cancer.  Ms. Hunzeker presents to clinic today, with her son [place] and a [place] interpreter, to discuss the possibility of a hereditary predisposition to cancer, genetic testing, and to further clarify her future cancer risks, as well as potential cancer risks for family members.   In 2018, at the age of 49, Ms. Burda was diagnosed with cancer of the left fallopian tube. This was treated with chemotherapy.  She is referred for genetic testing based on her cancer diagnosis.    CANCER HISTORY:    Cancer of left fallopian tube ([place])   07/28/2016 Initial Diagnosis    She presented to PCP complaining of heavy menstruation for almost 1 year      08/27/2016 Imaging    She was referred to see Dr. Talbert Nan after she started to have vaginal discharge. Pelvic US showed: 7.4 x 6.4 x 6.6 cm solid mass in the left adnexa with irregular borders and blood flow. The Pulsatile index is <1 and the resistive index is <0.4. There is a large amount of pelvic fluid. Concerning for possible ovarian cancer.        08/27/2016 Tumor Marker    Patient's tumor was tested for the following markers: CA125. Results of the tumor marker test revealed 1577      09/09/2016 Pathology Results    Primary Tumor Site(s):Left fallopian tube Tumor Size:Greatest dimension (cm): 3.5 cm Histologic Type:Serous high-grade carcinoma Histologic Grade:G3: Poorly differentiated  Ovarian Surface Involvement:Present  Specimen(s):Right ovary  Right Ovary - Extent:Not  involved  Specimen(s):Left ovary  Left Ovary - Extent:Involved  Specimen(s):Right fallopian tube  Right Fallopian Tube - Extent:Not involved  Specimen(s):Left fallopian tube  Left Fallopian Tube - Extent:Involved  Specimen(s):Omentum  Omentum - Extent:Not involved  Specimen(s):Uterus  Uterus - Extent:Not involved  Specimen(s):Cervix  Cervix - Extent:Not involved  Specimen(s):Peritoneum  Peritoneum - Extent:Not involved  Peritoneal Ascitic Fluid:Negative for malignancy (normal / benign)  Pleural Fluid:Not performed / unknown  LYMPH NODES  Pelvic Lymph Nodes:  Number of Pelvic Lymph Nodes Examined:Specify number: 17  Number of Pelvic Lymph Nodes Involved:None identified  Para-aortic Lymph Nodes:  Number of Para-aortic Lymph Nodes Examined:Specify number: 2  Number of Para-aortic Lymph Nodes Involved:None identified  STAGE (pTNM, AJCC 7th ed.)  Primary Tumor (pT):Fallopian tube  Fallopian Tube Primary Tumor (pT):pT2a: Extension and / or metastasis to the uterus and / or ovaries  Regional Lymph Nodes (pN):pN0: No regional lymph node metastasis  Distant Metastasis (pM):Not applicable - pM cannot be determined from the submitted specimen(s)  FIGO STAGE (FIGO 2014)  FIGO Stage:IIA: Extension and / or implants on the uterus and / or fallopian tubes and / or ovaries      09/09/2016 Pathology Results    Pelvic fluid, washing: No definitive malignant cells identified. Blood, histiocytes, and reactive mesothelial cells present. Mixed inflammation present. See comment.      09/09/2016 Surgery    Procedures: Bilateral - Robotic Laparoscopy, Surgical; With Bilateral Total Pelvic Lymphadenectomy Peri-Aortic Lymph Node Sample, Sgl/Mult Robotic Bilateral Salpingo-Oophorectomy  W/Omentectomy, Total Abd Hysterectomy &  Radical Dissection For Debulking;    Findings:  1. 9-10 cm left adnexal mass with rupture of tumor during manipulation 2. Dilated left fallopian tube removed in tact with IOFS: poorly differentiated carcinoma 3. Endometriosis implants on anterior and posterior cul-de-sac 4. Grossly normal omentum, diaphragm, small bowel mesenter 5. Grossly normal uterus and right fallopian tube/ovary 6. IOFS: anterior peritoneal biopsy (endometriosis) // left fallopian tube: poorly differentiated carcinoma 7. 1-2 cm tumor nodule in posterior cul-de-sac; completely resected 8. Non-enlarged lymph nodes 9. R0 resection at end of case 10. Ascites present      11/30/2016 Imaging    1. Nonspecific trace fluid in the pelvic cul-de-sac. No discrete mass, adenopathy or other specific finding of local tumor recurrence in the pelvis. 2. No evidence of metastatic disease in the chest or abdomen.        HORMONAL RISK FACTORS:  Menarche was at age 78.  First live birth at age 58.  OCP use for approximately 0 years.  Ovaries intact: no.  Hysterectomy: yes.  Menopausal status: postmenopausal.  HRT use: 0 years. Colonoscopy: no; not examined. Mammogram within the last year: yes. Number of breast biopsies: 0. Up to date with pelvic exams:  yes. Any excessive radiation exposure in the past:  no  Past Medical History:  Diagnosis Date  . Family history of breast cancer   . Migraines   . Ovarian cancer [facility])     Past Surgical History:  Procedure Laterality Date  . TOTAL ABDOMINAL HYSTERECTOMY W/ BILATERAL SALPINGOOPHORECTOMY      Social History   Social History  . Marital status: Married    Spouse name: N/A  . Number of children: 2  . Years of education: N/A   Occupational History  . packing socks    Social History Main Topics  . Smoking status: Never Smoker  . Smokeless tobacco: Never Used  . Alcohol use No  . Drug use: No  . Sexual activity: Yes     Partners: Male    Birth control/ protection: None   Other Topics Concern  . None   Social History Narrative  . None     FAMILY HISTORY:  We obtained a detailed, 4-generation family history.  Significant diagnoses are listed below: Family History  Problem Relation Age of Onset  . Hypertension Mother   . Thyroid cancer Maternal Uncle   . Breast cancer Cousin        maternal 1/2 uncle's daughter dx under 72    The patient has two children who are cancer free.  She has three brothers and they are cancer free.  Her parents are alive in their 97's and do not have cancer.  Her mother has one full brother who died of a heart attack.  She has three paternal half brothers and a paternal half sister.  One half brother died of thyroid cancer.  Another brother has a daughter dx with breast cancer under 18.  Her father had two sisters and two brothers who are cancer free.  There is no known cancer history on the paternal side.  Ms. Barth is unaware of previous family history of genetic testing for hereditary cancer risks. Patient's maternal ancestors are of [place] descent, and paternal ancestors are of [place] descent. There is no reported Ashkenazi Jewish ancestry. There is no known consanguinity.  GENETIC COUNSELING ASSESSMENT: Brandolyn Shortridge is a 51 y.o. female with a personal and family history which is somewhat suggestive of a hereditary breast and ovarian cancer syndrome and  predisposition to cancer. We, therefore, discussed and recommended the following at today's visit.   DISCUSSION: We discussed that about 20-25% of ovarian cancer is thought to be hereditary with most cases due to BRCA mutations.  We discussed that ovarian cancer is thought to start in the fallopian tubes and therefore fallopian tube cancer is treated similarly.  There are other genes also associated with hereditary ovarian cancer.  We reviewed the characteristics, features and inheritance patterns of hereditary cancer  syndromes. We also discussed genetic testing, including the appropriate family members to test, the process of testing, insurance coverage and turn-around-time for results. We discussed the implications of a negative, positive and/or variant of uncertain significant result. We recommended Ms. Jablon pursue genetic testing for the [facility] gene panel. The [facility] gene panel offered by [employer] includes sequencing and deletion/duplication testing of the following 28 genes: APC, ATM, BARD1, BMPR1A, BRCA1, BRCA2, BRIP1, CHD1, CDK4, CDKN2A, CHEK2, EPCAM (large rearrangement only), MLH1, MSH2, MSH6, MUTYH, NBN, PALB2, PMS2, PTEN, RAD51C, RAD51D, SMAD4, STK11, and TP53. Sequencing was performed for select regions of POLE and POLD1, and large rearrangement analysis was performed for select regions of GREM1.   Based on Ms. Muff's personal and family history of cancer, she meets medical criteria for genetic testing. Despite that she meets criteria, she may still have an out of pocket cost. We discussed that if her out of pocket cost for testing is over $100, the laboratory will call and confirm whether she wants to proceed with testing.  If the out of pocket cost of testing is less than $100 she will be billed by the genetic testing laboratory.   We discussed that genetic testing through [facility] will test for hereditary mutations that could explain her diagnosis of cancer.  However, homologous recombination testing (HRD) is genetic testing performed on her tumor that can determine genetic changes that could influence her management.  HRD testing is performed in tandem with genetic testing, and typically at no additional cost.   PLAN: After considering the risks, benefits, and limitations, Ms. Melchor  provided informed consent to pursue genetic testing and the blood sample was sent to [facility] for analysis of the [facility] gene panel and HRD testing. Results should be available within  approximately 2-3 weeks' time, at which point they will be disclosed by telephone to Ms. Metheny, as will any additional recommendations warranted by these results. Ms. Economos will receive a summary of her genetic counseling visit and a copy of her results once available. This information will also be available in Epic. We encouraged Ms. Vari to remain in contact with cancer genetics annually so that we can continuously update the family history and inform her of any changes in cancer genetics and testing that may be of benefit for her family. Ms. Lavalle questions were answered to her satisfaction today. Our contact information was provided should additional questions or concerns arise.  Lastly, we encouraged Ms. Broner to remain in contact with cancer genetics annually so that we can continuously update the family history and inform her of any changes in cancer genetics and testing that may be of benefit for this family.   Ms.  Sara questions were answered to her satisfaction today. Our contact information was provided should additional questions or concerns arise. Thank you for the referral and allowing [place] to share in the care of your patient.   Karen P. Florene Glen, [place], [facility] Certified Genetic Counselor [email or web address] phone: [phone]  The patient was seen for a  total of 45 minutes in face-to-face genetic counseling.  This patient was discussed with Drs. Magrinat, Lindi Adie and/or Burr Medico who agrees with the above.    _______________________________________________________________________ For Office Staff:  Number of people involved in session: 3 Was an Intern/ student involved with case: no

## 2016-12-01 NOTE — Sedation Documentation (Signed)
Patient is resting comfortably. 

## 2016-12-01 NOTE — Discharge Instructions (Signed)
Implanted Port Insertion, Care After This sheet gives you information about how to care for yourself after your procedure. Your health care provider may also give you more specific instructions. If you have problems or questions, contact your health care provider. What can I expect after the procedure? After your procedure, it is common to have:  Discomfort at the port insertion site.  Bruising on the skin over the port. This should improve over 3-4 days. Follow these instructions at home: [facility] care   After your port is placed, you will get a manufacturer's information card. The card has information about your port. Keep this card with you at all times.  Take care of the port as told by your health care provider. Ask your health care provider if you or a family member can get training for taking care of the port at home. A home health care nurse may also take care of the port.  Make sure to remember what type of port you have. Incision care   Follow instructions from your health care provider about how to take care of your port insertion site. Make sure you:  Wash your hands with soap and water before you change your bandage (dressing). If soap and water are not available, use hand sanitizer.  Change your dressing as told by your health care provider.  May remove dressing and shower in 24 to 48 hours. Keep wound clean and dry.  Leave stitches (sutures), skin glue, or adhesive strips in place. These skin closures may need to stay in place for 2 weeks or longer. If adhesive strip edges start to loosen and curl up, you may trim the loose edges. Do not remove adhesive strips completely unless your health care provider tells you to do that.  Check your port insertion site every day for signs of infection. Check for:  More redness, swelling, or pain.  More fluid or blood.  Warmth.  Pus or a bad smell. General instructions   Do not take baths, swim, or use a hot tub until your health  care provider approves.  Do not lift anything that is heavier than 10 lb (4.5 kg) for a week, or as told by your health care provider.  Ask your health care provider when it is okay to:  Return to work or school.  Resume usual physical activities or sports.  Do not drive for 24 hours if you were given a medicine to help you relax (sedative).  Take over-the-counter and prescription medicines only as told by your health care provider.  Wear a medical alert bracelet in case of an emergency. This will tell any health care providers that you have a port.  Keep all follow-up visits as told by your health care provider. This is important. Contact a health care provider if:  You cannot flush your port with saline as directed, or you cannot draw blood from the port.  You have a fever or chills.  You have more redness, swelling, or pain around your port insertion site.  You have more fluid or blood coming from your port insertion site.  Your port insertion site feels warm to the touch.  You have pus or a bad smell coming from the port insertion site. Get help right away if:  You have chest pain or shortness of breath.  You have bleeding from your port that you cannot control. Summary  Take care of the port as told by your health care provider.  Change your dressing as  told by your health care provider.  Keep all follow-up visits as told by your health care provider. This information is not intended to replace advice given to you by your health care provider. Make sure you discuss any questions you have with your health care provider. Document Released: 04/19/2013 Document Revised: 05/20/2016 Document Reviewed: 05/20/2016 Elsevier Interactive Patient Education  2017 [employer].   Moderate Conscious Sedation, Adult, Care After These instructions provide you with information about caring for yourself after your procedure. Your health care provider may also give you more specific  instructions. Your treatment has been planned according to current medical practices, but problems sometimes occur. Call your health care provider if you have any problems or questions after your procedure. What can I expect after the procedure? After your procedure, it is common:  To feel sleepy for several hours.  To feel clumsy and have poor balance for several hours.  To have poor judgment for several hours.  To vomit if you eat too soon. Follow these instructions at home: For at least 24 hours after the procedure:    Do not:  Participate in activities where you could fall or become injured.  Drive.  Use heavy machinery.  Drink alcohol.  Take sleeping pills or medicines that cause drowsiness.  Make important decisions or sign legal documents.  Take care of children on your own.  Rest. Eating and drinking   Follow the diet recommended by your health care provider.  If you vomit:  Drink water, juice, or soup when you can drink without vomiting.  Make sure you have little or no nausea before eating solid foods. General instructions   Have a responsible adult stay with you until you are awake and alert.  Take over-the-counter and prescription medicines only as told by your health care provider.  If you smoke, do not smoke without supervision.  Keep all follow-up visits as told by your health care provider. This is important. Contact a health care provider if:  You keep feeling nauseous or you keep vomiting.  You feel light-headed.  You develop a rash.  You have a fever. Get help right away if:  You have trouble breathing. This information is not intended to replace advice given to you by your health care provider. Make sure you discuss any questions you have with your health care provider. Document Released: 04/19/2013 Document Revised: 12/02/2015 Document Reviewed: 10/19/2015 Elsevier Interactive Patient Education  2017 [employer].

## 2016-12-01 NOTE — Sedation Documentation (Signed)
Pt moaning in pain d/t lidocaine injection on skin by MD.

## 2016-12-01 NOTE — Sedation Documentation (Signed)
Dr. Laurence Ferrari made aware of pt's tachycardia in the 108-112 bpm.

## 2016-12-01 NOTE — Procedures (Signed)
Interventional Radiology Procedure Note  Procedure: Placement of a right IJ approach single lumen PowerPort.  Tip is positioned at the superior cavoatrial junction and catheter is ready for immediate use.  Complications: No immediate Recommendations:  - Ok to shower tomorrow - Do not submerge for 7 days - Routine line care   Signed,  Criselda Peaches, MD  t

## 2016-12-01 NOTE — Consult Note (Signed)
Chief Complaint: Patient was seen in consultation today for port a cath placement  Referring Physician(s): [facility]  Supervising Physician: Jacqulynn Cadet  Patient Status: [facility] - Out-pt  History of Present Illness: Laura Washington is a 51 y.o. female with history of recently diagnosed left fallopian tube carcinoma, s/p TAH/BSO, omentectomy and radical LN dissection in February of this year. She presents today for port a cath placement for chemotherapy.   Past Medical History:  Diagnosis Date  . Family history of breast cancer   . Migraines   . Ovarian cancer [facility])     Past Surgical History:  Procedure Laterality Date  . TOTAL ABDOMINAL HYSTERECTOMY W/ BILATERAL SALPINGOOPHORECTOMY      Allergies: Hydrocodone-acetaminophen  Medications: Prior to Admission medications   Medication Sig Start Date End Date Taking? Authorizing Provider  Cholecalciferol (VITAMIN D) 2000 units tablet Take one tablet twice daily. 05/03/15  Yes [provider]  Ibuprofen (ADVIL PO) Take by mouth as needed.   Yes [provider]  simethicone (MYLICON) 629 MG chewable tablet Chew 125 mg by mouth every 6 (six) hours as needed for flatulence.   Yes [provider]     Family History  Problem Relation Age of Onset  . Hypertension Mother   . Thyroid cancer Maternal Uncle   . Breast cancer Cousin        maternal 1/2 uncle's daughter dx under 32    Social History   Social History  . Marital status: Married    Spouse name: N/A  . Number of children: 2  . Years of education: N/A   Occupational History  . packing socks    Social History Main Topics  . Smoking status: Never Smoker  . Smokeless tobacco: Never Used  . Alcohol use No  . Drug use: No  . Sexual activity: Yes    Partners: Male    Birth control/ protection: None   Other Topics Concern  . None   Social History Narrative  . None      Review of Systems denies fever,CP,dyspnea, cough,  abd/back pain,N/V or bleeding; she does have occ HA's  Vital Signs:  Vitals:   12/01/16 1300  BP: 132/62  Pulse: 82  Resp: 16  Temp: 98.4 F (36.9 C)       Physical Exam awake, alert. Chest clear to auscultation bilaterally. Heart with regular rate and rhythm. Abdomen soft, positive bowel sounds, nontender. Lower extremities -no edema.  Mallampati Score:     Imaging: Ct Chest W Contrast  Result Date: 11/30/2016 CLINICAL DATA:  FIGO IIA left fallopian tube cancer diagnosed in February 2018 status post TAHBSO, omentectomy and radical dissection, presenting for restaging prior to adjuvant chemotherapy. EXAM: CT CHEST, ABDOMEN, AND PELVIS WITH CONTRAST TECHNIQUE: Multidetector CT imaging of the chest, abdomen and pelvis was performed following the standard protocol during bolus administration of intravenous contrast. CONTRAST:  122mL ISOVUE-300 IOPAMIDOL (ISOVUE-300) INJECTION 61% COMPARISON:  Outside pelvic sonogram from 08/27/2016. FINDINGS: CT CHEST FINDINGS Cardiovascular: Normal heart size. No significant pericardial fluid/thickening. Great vessels are normal in course and caliber. No central pulmonary emboli. Mediastinum/Nodes: No discrete thyroid nodules. Unremarkable esophagus. No pathologically enlarged axillary, mediastinal or hilar lymph nodes. Lungs/Pleura: No pneumothorax. No pleural effusion. No acute consolidative airspace disease, lung masses or significant pulmonary nodules. Musculoskeletal: No aggressive appearing focal osseous lesions. Minimal thoracic spondylosis. CT ABDOMEN PELVIS FINDINGS Hepatobiliary: Normal liver with no liver mass. Normal gallbladder with no radiopaque cholelithiasis. No biliary ductal dilatation. Pancreas: Normal, with  no mass or duct dilation. Spleen: Normal size. No mass. Adrenals/Urinary Tract: Normal adrenals. Normal kidneys with no hydronephrosis and no renal mass. Relatively collapsed and grossly normal bladder. Stomach/Bowel: Grossly normal  stomach. Normal caliber small bowel with no small bowel wall thickening. Candidate diminutive normal appendix with no pericecal inflammatory changes. Normal large bowel with no diverticulosis, large bowel wall thickening or pericolonic fat stranding. Vascular/Lymphatic: Normal caliber abdominal aorta. Patent portal, splenic, hepatic and renal veins. No pathologically enlarged lymph nodes in the abdomen or pelvis. Reproductive: Status post hysterectomy, with no abnormal findings at the vaginal cuff. No adnexal mass. Other: No pneumoperitoneum. No focal fluid collection. Nonspecific trace fluid in the pelvic cul-de-sac. Subcentimeter soft tissue nodule anterior to the spleen is favored to represent a tiny splenule (series 2/image 47). Musculoskeletal: No aggressive appearing focal osseous lesions. Mild lumbar spondylosis. IMPRESSION: 1. Nonspecific trace fluid in the pelvic cul-de-sac. No discrete mass, adenopathy or other specific finding of local tumor recurrence in the pelvis. 2. No evidence of metastatic disease in the chest or abdomen. Electronically Signed   By: Ilona Sorrel M.D.   On: 11/30/2016 13:57   Ct Abdomen Pelvis W Contrast  Result Date: 11/30/2016 CLINICAL DATA:  FIGO IIA left fallopian tube cancer diagnosed in February 2018 status post TAHBSO, omentectomy and radical dissection, presenting for restaging prior to adjuvant chemotherapy. EXAM: CT CHEST, ABDOMEN, AND PELVIS WITH CONTRAST TECHNIQUE: Multidetector CT imaging of the chest, abdomen and pelvis was performed following the standard protocol during bolus administration of intravenous contrast. CONTRAST:  198mL ISOVUE-300 IOPAMIDOL (ISOVUE-300) INJECTION 61% COMPARISON:  Outside pelvic sonogram from 08/27/2016. FINDINGS: CT CHEST FINDINGS Cardiovascular: Normal heart size. No significant pericardial fluid/thickening. Great vessels are normal in course and caliber. No central pulmonary emboli. Mediastinum/Nodes: No discrete thyroid nodules.  Unremarkable esophagus. No pathologically enlarged axillary, mediastinal or hilar lymph nodes. Lungs/Pleura: No pneumothorax. No pleural effusion. No acute consolidative airspace disease, lung masses or significant pulmonary nodules. Musculoskeletal: No aggressive appearing focal osseous lesions. Minimal thoracic spondylosis. CT ABDOMEN PELVIS FINDINGS Hepatobiliary: Normal liver with no liver mass. Normal gallbladder with no radiopaque cholelithiasis. No biliary ductal dilatation. Pancreas: Normal, with no mass or duct dilation. Spleen: Normal size. No mass. Adrenals/Urinary Tract: Normal adrenals. Normal kidneys with no hydronephrosis and no renal mass. Relatively collapsed and grossly normal bladder. Stomach/Bowel: Grossly normal stomach. Normal caliber small bowel with no small bowel wall thickening. Candidate diminutive normal appendix with no pericecal inflammatory changes. Normal large bowel with no diverticulosis, large bowel wall thickening or pericolonic fat stranding. Vascular/Lymphatic: Normal caliber abdominal aorta. Patent portal, splenic, hepatic and renal veins. No pathologically enlarged lymph nodes in the abdomen or pelvis. Reproductive: Status post hysterectomy, with no abnormal findings at the vaginal cuff. No adnexal mass. Other: No pneumoperitoneum. No focal fluid collection. Nonspecific trace fluid in the pelvic cul-de-sac. Subcentimeter soft tissue nodule anterior to the spleen is favored to represent a tiny splenule (series 2/image 47). Musculoskeletal: No aggressive appearing focal osseous lesions. Mild lumbar spondylosis. IMPRESSION: 1. Nonspecific trace fluid in the pelvic cul-de-sac. No discrete mass, adenopathy or other specific finding of local tumor recurrence in the pelvis. 2. No evidence of metastatic disease in the chest or abdomen. Electronically Signed   By: Ilona Sorrel M.D.   On: 11/30/2016 13:57    Labs:  CBC:  Recent Labs  12/01/16 1242  WBC 5.8  HGB 9.7*  HCT  33.3*  PLT 321    COAGS:  Recent Labs  12/01/16 1242  INR 0.99    BMP: No results for input(s): NA, K, CL, CO2, GLUCOSE, BUN, CALCIUM, CREATININE, GFRNONAA, GFRAA in the last 8760 hours.  Invalid input(s): CMP  LIVER FUNCTION TESTS: No results for input(s): BILITOT, AST, ALT, ALKPHOS, PROT, ALBUMIN in the last 8760 hours.  TUMOR MARKERS: No results for input(s): AFPTM, CEA, CA199, CHROMGRNA in the last 8760 hours.  Assessment and Plan: 51 y.o. female with history of recently diagnosed left fallopian tube carcinoma, s/p TAH/BSO, omentectomy and radical LN dissection in February of this year. She presents today for port a cath placement for chemotherapy. Risks and benefits discussed with the patient/son via interpreter including, but not limited to bleeding, infection, pneumothorax, or fibrin sheath development and need for additional procedures.All of the patient's questions were answered, patient is agreeable to proceed.Consent signed and in chart.      Thank you for this interesting consult.  I greatly enjoyed meeting Laura Washington and look forward to participating in their care.  A copy of this report was sent to the requesting provider on this date.  Electronically Signed: D. Rowe Robert, PA-C 12/01/2016, 1:25 PM   I spent a total of 20 minutes  in face to face in clinical consultation, greater than 50% of which was counseling/coordinating care for port a cath placement

## 2016-12-01 NOTE — Progress Notes (Signed)
pts son assisted with interpreting discharge instruction due to interpreter left; unable to use mobile device due to language.

## 2016-12-02 ENCOUNTER — Other Ambulatory Visit: Payer: BLUE CROSS/BLUE SHIELD

## 2016-12-02 ENCOUNTER — Ambulatory Visit (HOSPITAL_BASED_OUTPATIENT_CLINIC_OR_DEPARTMENT_OTHER): Payer: BLUE CROSS/BLUE SHIELD | Admitting: Hematology and Oncology

## 2016-12-02 ENCOUNTER — Encounter: Payer: Self-pay | Admitting: Hematology and Oncology

## 2016-12-02 ENCOUNTER — Other Ambulatory Visit: Payer: Self-pay | Admitting: Hematology and Oncology

## 2016-12-02 ENCOUNTER — Encounter: Payer: Self-pay | Admitting: *Deleted

## 2016-12-02 ENCOUNTER — Ambulatory Visit: Payer: BLUE CROSS/BLUE SHIELD

## 2016-12-02 ENCOUNTER — Telehealth: Payer: Self-pay | Admitting: Hematology and Oncology

## 2016-12-02 ENCOUNTER — Telehealth: Payer: Self-pay | Admitting: *Deleted

## 2016-12-02 VITALS — BP 138/68 | HR 79 | Temp 98.2°F | Resp 20 | Ht 62.25 in | Wt 121.7 lb

## 2016-12-02 DIAGNOSIS — D509 Iron deficiency anemia, unspecified: Secondary | ICD-10-CM

## 2016-12-02 DIAGNOSIS — C5702 Malignant neoplasm of left fallopian tube: Secondary | ICD-10-CM

## 2016-12-02 LAB — COMPREHENSIVE METABOLIC PANEL
ALBUMIN: 4.2 g/dL (ref 3.5–5.0)
ALK PHOS: 107 U/L (ref 40–150)
ALT: 20 U/L (ref 0–55)
AST: 21 U/L (ref 5–34)
Anion Gap: 10 mEq/L (ref 3–11)
BILIRUBIN TOTAL: 0.45 mg/dL (ref 0.20–1.20)
BUN: 7.9 mg/dL (ref 7.0–26.0)
CALCIUM: 9.9 mg/dL (ref 8.4–10.4)
CO2: 28 mEq/L (ref 22–29)
Chloride: 106 mEq/L (ref 98–109)
Creatinine: 0.8 mg/dL (ref 0.6–1.1)
EGFR: 86 mL/min/{1.73_m2} — AB (ref >90)
Glucose: 95 mg/dl (ref 70–140)
POTASSIUM: 4.6 meq/L (ref 3.5–5.1)
Sodium: 143 mEq/L (ref 136–145)
Total Protein: 8 g/dL (ref 6.4–8.3)

## 2016-12-02 LAB — FERRITIN: Ferritin: <4 ng/ml — ABNORMAL LOW (ref 9–269)

## 2016-12-02 LAB — CBC WITH DIFFERENTIAL/PLATELET
BASO%: 0.6 % (ref 0.0–2.0)
BASOS ABS: 0 10*3/uL (ref 0.0–0.1)
EOS ABS: 0 10*3/uL (ref 0.0–0.5)
EOS%: 0.1 % (ref 0.0–7.0)
HEMATOCRIT: 33.3 % — AB (ref 34.8–46.6)
HGB: 10.2 g/dL — ABNORMAL LOW (ref 11.6–15.9)
LYMPH%: 17.3 % (ref 14.0–49.7)
MCH: 21.4 pg — AB (ref 25.1–34.0)
MCHC: 30.7 g/dL — ABNORMAL LOW (ref 31.5–36.0)
MCV: 69.6 fL — AB (ref 79.5–101.0)
MONO#: 0.3 10*3/uL (ref 0.1–0.9)
MONO%: 6 % (ref 0.0–14.0)
NEUT#: 3.7 10*3/uL (ref 1.5–6.5)
NEUT%: 76 % (ref 38.4–76.8)
PLATELETS: 292 10*3/uL (ref 145–400)
RBC: 4.79 10*6/uL (ref 3.70–5.45)
RDW: 17 % — AB (ref 11.2–14.5)
WBC: 4.9 10*3/uL (ref 3.9–10.3)
lymph#: 0.8 10*3/uL — ABNORMAL LOW (ref 0.9–3.3)

## 2016-12-02 LAB — IRON AND TIBC
%SAT: 4 % — ABNORMAL LOW (ref 21–57)
Iron: 20 ug/dL — ABNORMAL LOW (ref 41–142)
TIBC: 483 ug/dL — AB (ref 236–444)
UIBC: 463 ug/dL — ABNORMAL HIGH (ref 120–384)

## 2016-12-02 MED ORDER — PROCHLORPERAZINE MALEATE 10 MG PO TABS: 10.0000 mg | ORAL_TABLET | Freq: Four times a day (QID) | ORAL | 1 refills | Status: DC | PRN

## 2016-12-02 MED ORDER — ONDANSETRON HCL 8 MG PO TABS: 8.0000 mg | ORAL_TABLET | Freq: Two times a day (BID) | ORAL | 1 refills | Status: DC | PRN

## 2016-12-02 MED ORDER — LIDOCAINE-PRILOCAINE 2.5-2.5 % EX CREA: TOPICAL_CREAM | CUTANEOUS | 3 refills | Status: DC

## 2016-12-02 MED ORDER — DEXAMETHASONE 4 MG PO TABS: ORAL_TABLET | ORAL | 1 refills | Status: DC

## 2016-12-02 NOTE — Telephone Encounter (Signed)
Lab added for today,per 12/02/16 los.  Patient was given a copy of the AVS report and appointment schedule, per 12/02/16 los.

## 2016-12-02 NOTE — Assessment & Plan Note (Signed)
The patient has no evidence of cancer We will draw baseline blood work in [place] [address] She has seen Dietitian, results pending Per prior discussion, I recommend adjuvant chemotherapy with 6 cycles of carboplatin and Taxol She is instructed to take premedications 20 mg of dexamethasone in the morning before treatment We discussed the risks, benefits, side effects of chemo We discussed the role of chemotherapy. The intent is of curative intent.  We discussed some of the risks, benefits, side-effects of carboplatin and Taxol  Some of the short term side-effects included, though not limited to, including weight loss, life threatening infections, risk of allergic reactions, need for transfusions of blood products, nausea, vomiting, change in bowel habits, loss of hair, admission to hospital for various reasons, and risks of death.   Long term side-effects are also discussed including risks of infertility, permanent damage to nerve function, hearing loss, chronic fatigue, kidney damage with possibility needing hemodialysis, and rare secondary malignancy including bone marrow disorders.  The patient is aware that the response rates discussed earlier is not guaranteed.  After a long discussion, patient made an informed decision to proceed with the prescribed plan of care.   Patient education material was dispensed. I will see her prior to cycle 2 of treatment

## 2016-12-02 NOTE — Assessment & Plan Note (Signed)
She has profound iron deficiency anemia after surgery Recommend oral iron supplement daily

## 2016-12-02 NOTE — Progress Notes (Signed)
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 Talbert Nan after she started to have vaginal discharge. Pelvic US showed: 7.4 x 6.4 x 6.6 cm solid mass in the left adnexa with irregular borders and blood flow. The Pulsatile index is <1 and the resistive index is <0.4. There is a large amount of pelvic fluid. Concerning for possible ovarian cancer.        08/27/2016 Tumor Marker    Patient's tumor was tested for the following markers: CA125. Results of the tumor marker test revealed 1577      09/09/2016 Pathology Results    Primary Tumor Site(s):Left fallopian tube Tumor Size:Greatest dimension (cm): 3.5 cm Histologic Type:Serous high-grade carcinoma Histologic Grade:G3: Poorly differentiated  Ovarian Surface Involvement:Present  Specimen(s):Right ovary  Right Ovary - Extent:Not involved  Specimen(s):Left ovary  Left Ovary - Extent:Involved  Specimen(s):Right fallopian tube  Right Fallopian Tube - Extent:Not involved  Specimen(s):Left fallopian tube  Left Fallopian Tube - Extent:Involved  Specimen(s):Omentum  Omentum - Extent:Not involved  Specimen(s):Uterus  Uterus - Extent:Not involved  Specimen(s):Cervix  Cervix - Extent:Not involved  Specimen(s):Peritoneum  Peritoneum - Extent:Not involved  Peritoneal Ascitic Fluid:Negative for malignancy (normal / benign)  Pleural Fluid:Not performed / unknown  LYMPH NODES  Pelvic Lymph Nodes:  Number of Pelvic Lymph Nodes Examined:Specify  number: 17  Number of Pelvic Lymph Nodes Involved:None identified  Para-aortic Lymph Nodes:  Number of Para-aortic Lymph Nodes Examined:Specify number: 2  Number of Para-aortic Lymph Nodes Involved:None identified  STAGE (pTNM, AJCC 7th ed.)  Primary Tumor (pT):Fallopian tube  Fallopian Tube Primary Tumor (pT):pT2a: Extension and / or metastasis to the uterus and / or ovaries  Regional Lymph Nodes (pN):pN0: No regional lymph node metastasis  Distant Metastasis (pM):Not applicable - pM cannot be determined from the submitted specimen(s)  FIGO STAGE (FIGO 2014)  FIGO Stage:IIA: Extension and / or implants on the uterus and / or fallopian tubes and / or ovaries      09/09/2016 Pathology Results    Pelvic fluid, washing: No definitive malignant cells identified. Blood, histiocytes, and reactive mesothelial cells present. Mixed inflammation present. See comment.      09/09/2016 Surgery    Procedures: Bilateral - Robotic Laparoscopy, Surgical; With Bilateral Total Pelvic Lymphadenectomy Peri-Aortic Lymph Node Sample, Sgl/Mult Robotic Bilateral Salpingo-Oophorectomy W/Omentectomy, Total Abd Hysterectomy & Radical Dissection For Debulking;    Findings:  1. 9-10 cm left adnexal mass with rupture of tumor during manipulation 2. Dilated left fallopian tube removed in tact with IOFS: poorly differentiated carcinoma 3. Endometriosis implants on anterior and posterior cul-de-sac 4. Grossly normal omentum, diaphragm, small bowel mesenter 5. Grossly normal uterus and right fallopian tube/ovary 6. IOFS: anterior peritoneal biopsy (endometriosis) // left fallopian tube: poorly differentiated carcinoma 7. 1-2 cm tumor nodule in posterior cul-de-sac; completely resected 8. Non-enlarged lymph nodes 9. R0 resection at end of case 10. Ascites present      11/30/2016 Imaging    1. Nonspecific trace fluid in the pelvic cul-de-sac. No discrete  mass, adenopathy or other specific finding of local tumor recurrence in the pelvis. 2. No evidence of metastatic disease in the chest or abdomen.  12/01/2016 Genetic Testing    Patient has genetic testing done for  Results revealed patient has the following mutation(s):       12/01/2016 Procedure    Successful placement of a right IJ approach Power Port with ultrasound and fluoroscopic guidance. The catheter is ready for use       INTERVAL HISTORY: Please see below for problem oriented charting. She returns with her daughter today. She feels well. She has some mild discomfort at the site of port placement but otherwise feels fine Denies recent vaginal bleeding.  No changes in bowel habits  REVIEW OF SYSTEMS:   Constitutional: Denies fevers, chills or abnormal weight loss Eyes: Denies blurriness of vision Ears, nose, mouth, throat, and face: Denies mucositis or sore throat Respiratory: Denies cough, dyspnea or wheezes Cardiovascular: Denies palpitation, chest discomfort or lower extremity swelling Gastrointestinal:  Denies nausea, heartburn or change in bowel habits Skin: Denies abnormal skin rashes Lymphatics: Denies new lymphadenopathy or easy bruising Neurological:Denies numbness, tingling or new weaknesses Behavioral/Psych: Mood is stable, no new changes  All other systems were reviewed with the patient and are negative.  I have reviewed the past medical history, past surgical history, social history and family history with the patient and they are unchanged from previous note.  ALLERGIES:  is allergic to hydrocodone-acetaminophen.  MEDICATIONS:  Current Outpatient Prescriptions  Medication Sig Dispense Refill  . Cholecalciferol (VITAMIN D) 2000 units tablet Take one tablet twice daily.    . Ibuprofen (ADVIL PO) Take by mouth as needed.    Marland Kitchen dexamethasone (DECADRON) 4 MG tablet Take 5 tablets at 6 am on the day of treatment, every 3 weeks 30 tablet 1  .  lidocaine-prilocaine (EMLA) cream Apply to affected area once 30 g 3  . ondansetron (ZOFRAN) 8 MG tablet Take 1 tablet (8 mg total) by mouth 2 (two) times daily as needed for refractory nausea / vomiting. Start on day 3 after chemo. 30 tablet 1  . ondansetron (ZOFRAN-ODT) 4 MG disintegrating tablet DIS ONE T PO Q 6 H PRN FOR UP TO 7 DAYS  0  . prochlorperazine (COMPAZINE) 10 MG tablet Take 1 tablet (10 mg total) by mouth every 6 (six) hours as needed (Nausea or vomiting). 30 tablet 1  . simethicone (MYLICON) 354 MG chewable tablet Chew 125 mg by mouth every 6 (six) hours as needed for flatulence.     No current facility-administered medications for this visit.     PHYSICAL EXAMINATION: ECOG PERFORMANCE STATUS: 0 - Asymptomatic  Vitals:   12/02/16 1126  BP: 138/68  Pulse: 79  Resp: 20  Temp: 98.2 F (36.8 C)   Filed Weights   12/02/16 1126  Weight: 121 lb 11.2 oz (55.2 kg)    GENERAL:alert, no distress and comfortable SKIN: skin color, texture, turgor are normal, no rashes or significant lesions EYES: normal, Conjunctiva are pink and non-injected, sclera clear OROPHARYNX:no exudate, no erythema and lips, buccal mucosa, and tongue normal  NECK: supple, thyroid normal size, non-tender, without nodularity LYMPH:  no palpable lymphadenopathy in the cervical, axillary or inguinal LUNGS: clear to auscultation and percussion with normal breathing effort HEART: regular rate & rhythm and no murmurs and no lower extremity edema ABDOMEN:abdomen soft, non-tender and normal bowel sounds Musculoskeletal:no cyanosis of digits and no clubbing  NEURO: alert & oriented x 3 with fluent speech, no focal motor/sensory deficits  LABORATORY DATA:  I have reviewed the data as listed    Component Value Date/Time   NA 143 12/02/2016  1205   K 4.6 12/02/2016 1205   CL 103 12/01/2016 1242   CO2 28 12/02/2016 1205   GLUCOSE 95 12/02/2016 1205   BUN 7.9 12/02/2016 1205   CREATININE 0.8 12/02/2016 1205    CALCIUM 9.9 12/02/2016 1205   PROT 8.0 12/02/2016 1205   ALBUMIN 4.2 12/02/2016 1205   AST 21 12/02/2016 1205   ALT 20 12/02/2016 1205   ALKPHOS 107 12/02/2016 1205   BILITOT 0.45 12/02/2016 1205   GFRNONAA >60 12/01/2016 1242   GFRAA >60 12/01/2016 1242    No results found for: SPEP, UPEP  Lab Results  Component Value Date   WBC 4.9 12/02/2016   NEUTROABS 3.7 12/02/2016   HGB 10.2 (L) 12/02/2016   HCT 33.3 (L) 12/02/2016   MCV 69.6 (L) 12/02/2016   PLT 292 12/02/2016      Chemistry      Component Value Date/Time   NA 143 12/02/2016 1205   K 4.6 12/02/2016 1205   CL 103 12/01/2016 1242   CO2 28 12/02/2016 1205   BUN 7.9 12/02/2016 1205   CREATININE 0.8 12/02/2016 1205      Component Value Date/Time   CALCIUM 9.9 12/02/2016 1205   ALKPHOS 107 12/02/2016 1205   AST 21 12/02/2016 1205   ALT 20 12/02/2016 1205   BILITOT 0.45 12/02/2016 1205       RADIOGRAPHIC STUDIES: I have personally reviewed the radiological images as listed and agreed with the findings in the report. Ct Chest W Contrast  Result Date: 11/30/2016 CLINICAL DATA:  FIGO IIA left fallopian tube cancer diagnosed in February 2018 status post TAHBSO, omentectomy and radical dissection, presenting for restaging prior to adjuvant chemotherapy. EXAM: CT CHEST, ABDOMEN, AND PELVIS WITH CONTRAST TECHNIQUE: Multidetector CT imaging of the chest, abdomen and pelvis was performed following the standard protocol during bolus administration of intravenous contrast. CONTRAST:  16mL ISOVUE-300 IOPAMIDOL (ISOVUE-300) INJECTION 61% COMPARISON:  Outside pelvic sonogram from 08/27/2016. FINDINGS: CT CHEST FINDINGS Cardiovascular: Normal heart size. No significant pericardial fluid/thickening. Great vessels are normal in course and caliber. No central pulmonary emboli. Mediastinum/Nodes: No discrete thyroid nodules. Unremarkable esophagus. No pathologically enlarged axillary, mediastinal or hilar lymph nodes. Lungs/Pleura:  No pneumothorax. No pleural effusion. No acute consolidative airspace disease, lung masses or significant pulmonary nodules. Musculoskeletal: No aggressive appearing focal osseous lesions. Minimal thoracic spondylosis. CT ABDOMEN PELVIS FINDINGS Hepatobiliary: Normal liver with no liver mass. Normal gallbladder with no radiopaque cholelithiasis. No biliary ductal dilatation. Pancreas: Normal, with no mass or duct dilation. Spleen: Normal size. No mass. Adrenals/Urinary Tract: Normal adrenals. Normal kidneys with no hydronephrosis and no renal mass. Relatively collapsed and grossly normal bladder. Stomach/Bowel: Grossly normal stomach. Normal caliber small bowel with no small bowel wall thickening. Candidate diminutive normal appendix with no pericecal inflammatory changes. Normal large bowel with no diverticulosis, large bowel wall thickening or pericolonic fat stranding. Vascular/Lymphatic: Normal caliber abdominal aorta. Patent portal, splenic, hepatic and renal veins. No pathologically enlarged lymph nodes in the abdomen or pelvis. Reproductive: Status post hysterectomy, with no abnormal findings at the vaginal cuff. No adnexal mass. Other: No pneumoperitoneum. No focal fluid collection. Nonspecific trace fluid in the pelvic cul-de-sac. Subcentimeter soft tissue nodule anterior to the spleen is favored to represent a tiny splenule (series 2/image 47). Musculoskeletal: No aggressive appearing focal osseous lesions. Mild lumbar spondylosis. IMPRESSION: 1. Nonspecific trace fluid in the pelvic cul-de-sac. No discrete mass, adenopathy or other specific finding of local tumor recurrence in the pelvis. 2. No evidence of metastatic  disease in the chest or abdomen. Electronically Signed   By: Ilona Sorrel M.D.   On: 11/30/2016 13:57   Ct Abdomen Pelvis W Contrast  Result Date: 11/30/2016 CLINICAL DATA:  FIGO IIA left fallopian tube cancer diagnosed in February 2018 status post TAHBSO, omentectomy and radical  dissection, presenting for restaging prior to adjuvant chemotherapy. EXAM: CT CHEST, ABDOMEN, AND PELVIS WITH CONTRAST TECHNIQUE: Multidetector CT imaging of the chest, abdomen and pelvis was performed following the standard protocol during bolus administration of intravenous contrast. CONTRAST:  123mL ISOVUE-300 IOPAMIDOL (ISOVUE-300) INJECTION 61% COMPARISON:  Outside pelvic sonogram from 08/27/2016. FINDINGS: CT CHEST FINDINGS Cardiovascular: Normal heart size. No significant pericardial fluid/thickening. Great vessels are normal in course and caliber. No central pulmonary emboli. Mediastinum/Nodes: No discrete thyroid nodules. Unremarkable esophagus. No pathologically enlarged axillary, mediastinal or hilar lymph nodes. Lungs/Pleura: No pneumothorax. No pleural effusion. No acute consolidative airspace disease, lung masses or significant pulmonary nodules. Musculoskeletal: No aggressive appearing focal osseous lesions. Minimal thoracic spondylosis. CT ABDOMEN PELVIS FINDINGS Hepatobiliary: Normal liver with no liver mass. Normal gallbladder with no radiopaque cholelithiasis. No biliary ductal dilatation. Pancreas: Normal, with no mass or duct dilation. Spleen: Normal size. No mass. Adrenals/Urinary Tract: Normal adrenals. Normal kidneys with no hydronephrosis and no renal mass. Relatively collapsed and grossly normal bladder. Stomach/Bowel: Grossly normal stomach. Normal caliber small bowel with no small bowel wall thickening. Candidate diminutive normal appendix with no pericecal inflammatory changes. Normal large bowel with no diverticulosis, large bowel wall thickening or pericolonic fat stranding. Vascular/Lymphatic: Normal caliber abdominal aorta. Patent portal, splenic, hepatic and renal veins. No pathologically enlarged lymph nodes in the abdomen or pelvis. Reproductive: Status post hysterectomy, with no abnormal findings at the vaginal cuff. No adnexal mass. Other: No pneumoperitoneum. No focal fluid  collection. Nonspecific trace fluid in the pelvic cul-de-sac. Subcentimeter soft tissue nodule anterior to the spleen is favored to represent a tiny splenule (series 2/image 47). Musculoskeletal: No aggressive appearing focal osseous lesions. Mild lumbar spondylosis. IMPRESSION: 1. Nonspecific trace fluid in the pelvic cul-de-sac. No discrete mass, adenopathy or other specific finding of local tumor recurrence in the pelvis. 2. No evidence of metastatic disease in the chest or abdomen. Electronically Signed   By: Ilona Sorrel M.D.   On: 11/30/2016 13:57   Ir US Guide Vasc Access Right  Result Date: 12/01/2016 INDICATION: 51 year old female with left fallopian tube cancer. She requires durable venous access for outpatient chemotherapy. EXAM: IMPLANTED PORT A CATH PLACEMENT WITH ULTRASOUND AND FLUOROSCOPIC GUIDANCE MEDICATIONS: 2 g Ancef; The antibiotic was administered within an appropriate time interval prior to skin puncture. ANESTHESIA/SEDATION: Versed 4 mg IV; Fentanyl 100 mcg IV; Moderate Sedation Time:  30 minutes The patient was continuously monitored during the procedure by the interventional radiology nurse under my direct supervision. FLUOROSCOPY TIME:  2 minutes, 36 seconds (13 mGy) COMPLICATIONS: None immediate. PROCEDURE: The right neck and chest was prepped with chlorhexidine, and draped in the usual sterile fashion using maximum barrier technique (cap and mask, sterile gown, sterile gloves, large sterile sheet, hand hygiene and cutaneous antiseptic). Antibiotic prophylaxis was provided with 2g Ancef administered IV one hour prior to skin incision. Local anesthesia was attained by infiltration with 1% lidocaine with epinephrine. Ultrasound demonstrated patency of the right internal jugular vein, and this was documented with an image. Under real-time ultrasound guidance, this vein was accessed with a 21 gauge micropuncture needle and image documentation was performed. A small dermatotomy was made at  the access site with an 11  scalpel. A 0.018" wire was advanced into the SVC and the access needle exchanged for a 247F micropuncture vascular sheath. The 0.018" wire was then removed and a 0.035" wire advanced into the IVC. An appropriate location for the subcutaneous reservoir was selected below the clavicle and an incision was made through the skin and underlying soft tissues. The subcutaneous tissues were then dissected using a combination of blunt and sharp surgical technique and a pocket was formed. A single lumen power injectable portacatheter was then tunneled through the subcutaneous tissues from the pocket to the dermatotomy and the port reservoir placed within the subcutaneous pocket. The venous access site was then serially dilated and a peel away vascular sheath placed over the wire. The wire was removed and the port catheter advanced into position under fluoroscopic guidance. The catheter tip is positioned in the upper right atrium. This was documented with a spot image. The portacatheter was then tested and found to flush and aspirate well. The port was flushed with saline followed by 100 units/mL heparinized saline. The pocket was then closed in two layers using first subdermal inverted interrupted absorbable sutures followed by a running subcuticular suture. The epidermis was then sealed with Dermabond. The dermatotomy at the venous access site was also closed with a single inverted subdermal suture and the epidermis sealed with Dermabond. IMPRESSION: Successful placement of a right IJ approach Power Port with ultrasound and fluoroscopic guidance. The catheter is ready for use. Signed, Criselda Peaches, MD [facility] Radiology Electronically Signed   By: Jacqulynn Cadet M.D.   On: 12/01/2016 16:22   Ir Fluoro Guide Port Insertion Right  Result Date: 12/01/2016 INDICATION: 51 year old female with left fallopian tube cancer. She requires durable  venous access for outpatient chemotherapy. EXAM: IMPLANTED PORT A CATH PLACEMENT WITH ULTRASOUND AND FLUOROSCOPIC GUIDANCE MEDICATIONS: 2 g Ancef; The antibiotic was administered within an appropriate time interval prior to skin puncture. ANESTHESIA/SEDATION: Versed 4 mg IV; Fentanyl 100 mcg IV; Moderate Sedation Time:  30 minutes The patient was continuously monitored during the procedure by the interventional radiology nurse under my direct supervision. FLUOROSCOPY TIME:  2 minutes, 36 seconds (13 mGy) COMPLICATIONS: None immediate. PROCEDURE: The right neck and chest was prepped with chlorhexidine, and draped in the usual sterile fashion using maximum barrier technique (cap and mask, sterile gown, sterile gloves, large sterile sheet, hand hygiene and cutaneous antiseptic). Antibiotic prophylaxis was provided with 2g Ancef administered IV one hour prior to skin incision. Local anesthesia was attained by infiltration with 1% lidocaine with epinephrine. Ultrasound demonstrated patency of the right internal jugular vein, and this was documented with an image. Under real-time ultrasound guidance, this vein was accessed with a 21 gauge micropuncture needle and image documentation was performed. A small dermatotomy was made at the access site with an 11 scalpel. A 0.018" wire was advanced into the SVC and the access needle exchanged for a 247F micropuncture vascular sheath. The 0.018" wire was then removed and a 0.035" wire advanced into the IVC. An appropriate location for the subcutaneous reservoir was selected below the clavicle and an incision was made through the skin and underlying soft tissues. The subcutaneous tissues were then dissected using a combination of blunt and sharp surgical technique and a pocket was formed. A single lumen power injectable portacatheter was then tunneled through the subcutaneous tissues from the pocket to the dermatotomy and the port reservoir placed within the subcutaneous pocket. The  venous access site was then serially  dilated and a peel away vascular sheath placed over the wire. The wire was removed and the port catheter advanced into position under fluoroscopic guidance. The catheter tip is positioned in the upper right atrium. This was documented with a spot image. The portacatheter was then tested and found to flush and aspirate well. The port was flushed with saline followed by 100 units/mL heparinized saline. The pocket was then closed in two layers using first subdermal inverted interrupted absorbable sutures followed by a running subcuticular suture. The epidermis was then sealed with Dermabond. The dermatotomy at the venous access site was also closed with a single inverted subdermal suture and the epidermis sealed with Dermabond. IMPRESSION: Successful placement of a right IJ approach Power Port with ultrasound and fluoroscopic guidance. The catheter is ready for use. Signed, Criselda Peaches, MD [facility] Radiology Electronically Signed   By: Jacqulynn Cadet M.D.   On: 12/01/2016 16:22    ASSESSMENT & PLAN:  Cancer of left fallopian tube [facility]) The patient has no evidence of cancer We will draw baseline blood work in [place] 125 She has seen Dietitian, results pending Per prior discussion, I recommend adjuvant chemotherapy with 6 cycles of carboplatin and Taxol She is instructed to take premedications 20 mg of dexamethasone in the morning before treatment We discussed the risks, benefits, side effects of chemo We discussed the role of chemotherapy. The intent is of curative intent.  We discussed some of the risks, benefits, side-effects of carboplatin and Taxol  Some of the short term side-effects included, though not limited to, including weight loss, life threatening infections, risk of allergic reactions, need for transfusions of blood products, nausea, vomiting, change in bowel habits, loss of hair,  admission to hospital for various reasons, and risks of death.   Long term side-effects are also discussed including risks of infertility, permanent damage to nerve function, hearing loss, chronic fatigue, kidney damage with possibility needing hemodialysis, and rare secondary malignancy including bone marrow disorders.  The patient is aware that the response rates discussed earlier is not guaranteed.  After a long discussion, patient made an informed decision to proceed with the prescribed plan of care.   Patient education material was dispensed. I will see her prior to cycle 2 of treatment    Iron deficiency anemia She has profound iron deficiency anemia after surgery Recommend oral iron supplement daily   Orders Placed This Encounter  Procedures  . CA 125    Standing Status:   Future    Number of Occurrences:   1    Standing Expiration Date:   01/06/2018  . Iron and TIBC    Standing Status:   Future    Number of Occurrences:   1    Standing Expiration Date:   01/06/2018  . Ferritin    Standing Status:   Future    Number of Occurrences:   1    Standing Expiration Date:   01/06/2018   All questions were answered. The patient knows to call the clinic with any problems, questions or concerns. No barriers to learning was detected. I spent 25 minutes counseling the patient face to face. The total time spent in the appointment was 40 minutes and more than 50% was on counseling and review of test results     Heath Lark, MD 12/02/2016 3:14 PM

## 2016-12-02 NOTE — Telephone Encounter (Signed)
-----   Message from Heath Lark, MD sent at 12/02/2016  2:20 PM EDT ----- Regarding: profound iron def Tell her labs came back very low iron She needs to go to pharmacy and take 1 tablet OTC iron sulfate daily at bed time ----- Message ----- From: Interface, Lab In Three Zero One Sent: 12/02/2016  12:43 PM To: Heath Lark, MD

## 2016-12-02 NOTE — Telephone Encounter (Signed)
Daughter notified of message below. Verbalized understanding

## 2016-12-03 LAB — CA 125: Cancer Antigen (CA) 125: 7.8 U/mL (ref 0.0–38.1)

## 2016-12-04 ENCOUNTER — Ambulatory Visit (HOSPITAL_BASED_OUTPATIENT_CLINIC_OR_DEPARTMENT_OTHER): Payer: BLUE CROSS/BLUE SHIELD

## 2016-12-04 ENCOUNTER — Telehealth: Payer: Self-pay | Admitting: Hematology and Oncology

## 2016-12-04 VITALS — BP 126/72 | HR 99 | Temp 98.9°F | Resp 16

## 2016-12-04 DIAGNOSIS — Z5111 Encounter for antineoplastic chemotherapy: Secondary | ICD-10-CM | POA: Diagnosis not present

## 2016-12-04 DIAGNOSIS — C5702 Malignant neoplasm of left fallopian tube: Secondary | ICD-10-CM

## 2016-12-04 MED ORDER — ANTICOAGULANT SODIUM CITRATE 4% (200MG/5ML) IV SOLN
5.0000 mL | Freq: Once | Status: AC
  Administered 2016-12-04: 5 mL via INTRAVENOUS
  Filled 2016-12-04: qty 5

## 2016-12-04 MED ORDER — PALONOSETRON HCL INJECTION 0.25 MG/5ML
0.2500 mg | Freq: Once | INTRAVENOUS | Status: AC
  Administered 2016-12-04: 0.25 mg via INTRAVENOUS

## 2016-12-04 MED ORDER — DIPHENHYDRAMINE HCL 50 MG/ML IJ SOLN
INTRAMUSCULAR | Status: AC
  Filled 2016-12-04: qty 1

## 2016-12-04 MED ORDER — FAMOTIDINE IN NACL 20-0.9 MG/50ML-% IV SOLN
INTRAVENOUS | Status: AC
  Filled 2016-12-04: qty 50

## 2016-12-04 MED ORDER — FAMOTIDINE IN NACL 20-0.9 MG/50ML-% IV SOLN
20.0000 mg | Freq: Once | INTRAVENOUS | Status: AC
  Administered 2016-12-04: 20 mg via INTRAVENOUS

## 2016-12-04 MED ORDER — PACLITAXEL CHEMO INJECTION 300 MG/50ML
175.0000 mg/m2 | Freq: Once | INTRAVENOUS | Status: AC
  Administered 2016-12-04: 276 mg via INTRAVENOUS
  Filled 2016-12-04: qty 46

## 2016-12-04 MED ORDER — SODIUM CHLORIDE 0.9 % IV SOLN
Freq: Once | INTRAVENOUS | Status: AC
  Administered 2016-12-04: 11:00:00 via INTRAVENOUS

## 2016-12-04 MED ORDER — PALONOSETRON HCL INJECTION 0.25 MG/5ML
INTRAVENOUS | Status: AC
  Filled 2016-12-04: qty 5

## 2016-12-04 MED ORDER — DIPHENHYDRAMINE HCL 50 MG/ML IJ SOLN
50.0000 mg | Freq: Once | INTRAMUSCULAR | Status: AC
Start: 2016-12-04 — End: 2016-12-04
  Administered 2016-12-04: 50 mg via INTRAVENOUS

## 2016-12-04 MED ORDER — SODIUM CHLORIDE 0.9 % IV SOLN
20.0000 mg | Freq: Once | INTRAVENOUS | Status: AC
  Administered 2016-12-04: 20 mg via INTRAVENOUS
  Filled 2016-12-04: qty 2

## 2016-12-04 MED ORDER — SODIUM CHLORIDE 0.9 % IV SOLN
596.4000 mg | Freq: Once | INTRAVENOUS | Status: AC
  Administered 2016-12-04: 600 mg via INTRAVENOUS
  Filled 2016-12-04: qty 60

## 2016-12-04 NOTE — Telephone Encounter (Signed)
Per 5/25 sch msg moved 6/15 appointments to 6/18. Spoke with patient dtr.

## 2016-12-04 NOTE — Patient Instructions (Signed)
Cancer Center Discharge Instructions for Patients Receiving Chemotherapy  Today you received the following chemotherapy agents: Taxol, Carboplatin   To help prevent nausea and vomiting after your treatment, we encourage you to take your nausea medication as prescribed.   If you develop nausea and vomiting that is not controlled by your nausea medication, call the clinic.   BELOW ARE SYMPTOMS THAT SHOULD BE REPORTED IMMEDIATELY:  *FEVER GREATER THAN 100.5 F  *CHILLS WITH OR WITHOUT FEVER  NAUSEA AND VOMITING THAT IS NOT CONTROLLED WITH YOUR NAUSEA MEDICATION  *UNUSUAL SHORTNESS OF BREATH  *UNUSUAL BRUISING OR BLEEDING  TENDERNESS IN MOUTH AND THROAT WITH OR WITHOUT PRESENCE OF ULCERS  *URINARY PROBLEMS  *BOWEL PROBLEMS  UNUSUAL RASH Items with * indicate a potential emergency and should be followed up as soon as possible.  Feel free to call the clinic you have any questions or concerns. The clinic phone number is [phone].  Please show the CHEMO ALERT CARD at check-in to the Emergency Department and triage nurse.  Paclitaxel injection What is this medicine? PACLITAXEL (PAK li TAX el) is a chemotherapy drug. It targets fast dividing cells, like cancer cells, and causes these cells to die. This medicine is used to treat ovarian cancer, breast cancer, and other cancers. This medicine may be used for other purposes; ask your health care provider or pharmacist if you have questions. COMMON BRAND NAME(S): Onxol, Taxol What should I tell my health care provider before I take this medicine? They need to know if you have any of these conditions: -blood disorders -irregular heartbeat -infection (especially a virus infection such as chickenpox, cold sores, or herpes) -liver disease -previous or ongoing radiation therapy -an unusual or allergic reaction to paclitaxel, alcohol, polyoxyethylated castor oil, other chemotherapy agents, other medicines, foods, dyes, or  preservatives -pregnant or trying to get pregnant -breast-feeding How should I use this medicine? This drug is given as an infusion into a vein. It is administered in a hospital or clinic by a specially trained health care professional. Talk to your pediatrician regarding the use of this medicine in children. Special care may be needed. Overdosage: If you think you have taken too much of this medicine contact a poison control center or emergency room at once. NOTE: This medicine is only for you. Do not share this medicine with others. What if I miss a dose? It is important not to miss your dose. Call your doctor or health care professional if you are unable to keep an appointment. What may interact with this medicine? Do not take this medicine with any of the following medications: -disulfiram -metronidazole This medicine may also interact with the following medications: -cyclosporine -diazepam -ketoconazole -medicines to increase blood counts like filgrastim, pegfilgrastim, sargramostim -other chemotherapy drugs like cisplatin, doxorubicin, epirubicin, etoposide, teniposide, vincristine -quinidine -testosterone -vaccines -verapamil Talk to your doctor or health care professional before taking any of these medicines: -acetaminophen -aspirin -ibuprofen -ketoprofen -naproxen This list may not describe all possible interactions. Give your health care provider a list of all the medicines, herbs, non-prescription drugs, or dietary supplements you use. Also tell them if you smoke, drink alcohol, or use illegal drugs. Some items may interact with your medicine. What should I watch for while using this medicine? Your condition will be monitored carefully while you are receiving this medicine. You will need important blood work done while you are taking this medicine. This medicine can cause serious allergic reactions. To reduce your risk you will need to   take other medicine(s) before  treatment with this medicine. If you experience allergic reactions like skin rash, itching or hives, swelling of the face, lips, or tongue, tell your doctor or health care professional right away. In some cases, you may be given additional medicines to help with side effects. Follow all directions for their use. This drug may make you feel generally unwell. This is not uncommon, as chemotherapy can affect healthy cells as well as cancer cells. Report any side effects. Continue your course of treatment even though you feel ill unless your doctor tells you to stop. Call your doctor or health care professional for advice if you get a fever, chills or sore throat, or other symptoms of a cold or flu. Do not treat yourself. This drug decreases your body's ability to fight infections. Try to avoid being around people who are sick. This medicine may increase your risk to bruise or bleed. Call your doctor or health care professional if you notice any unusual bleeding. Be careful brushing and flossing your teeth or using a toothpick because you may get an infection or bleed more easily. If you have any dental work done, tell your dentist you are receiving this medicine. Avoid taking products that contain aspirin, acetaminophen, ibuprofen, naproxen, or ketoprofen unless instructed by your doctor. These medicines may hide a fever. Do not become pregnant while taking this medicine. Women should inform their doctor if they wish to become pregnant or think they might be pregnant. There is a potential for serious side effects to an unborn child. Talk to your health care professional or pharmacist for more information. Do not breast-feed an infant while taking this medicine. Men are advised not to father a child while receiving this medicine. This product may contain alcohol. Ask your pharmacist or healthcare provider if this medicine contains alcohol. Be sure to tell all healthcare providers you are taking this medicine.  Certain medicines, like metronidazole and disulfiram, can cause an unpleasant reaction when taken with alcohol. The reaction includes flushing, headache, nausea, vomiting, sweating, and increased thirst. The reaction can last from 30 minutes to several hours. What side effects may I notice from receiving this medicine? Side effects that you should report to your doctor or health care professional as soon as possible: -allergic reactions like skin rash, itching or hives, swelling of the face, lips, or tongue -low blood counts - This drug may decrease the number of white blood cells, red blood cells and platelets. You may be at increased risk for infections and bleeding. -signs of infection - fever or chills, cough, sore throat, pain or difficulty passing urine -signs of decreased platelets or bleeding - bruising, pinpoint red spots on the skin, black, tarry stools, nosebleeds -signs of decreased red blood cells - unusually weak or tired, fainting spells, lightheadedness -breathing problems -chest pain -high or low blood pressure -mouth sores -nausea and vomiting -pain, swelling, redness or irritation at the injection site -pain, tingling, numbness in the hands or feet -slow or irregular heartbeat -swelling of the ankle, feet, hands Side effects that usually do not require medical attention (report to your doctor or health care professional if they continue or are bothersome): -bone pain -complete hair loss including hair on your head, underarms, pubic hair, eyebrows, and eyelashes -changes in the color of fingernails -diarrhea -loosening of the fingernails -loss of appetite -muscle or joint pain -red flush to skin -sweating This list may not describe all possible side effects. Call your doctor for medical advice about   side effects. You may report side effects to FDA at 1-800-FDA-1088. Where should I keep my medicine? This drug is given in a hospital or clinic and will not be stored at  home. NOTE: This sheet is a summary. It may not cover all possible information. If you have questions about this medicine, talk to your doctor, pharmacist, or health care provider.  2018 [employer] Standard (2015-04-30 19:58:00)  Carboplatin injection What is this medicine? CARBOPLATIN (KAR boe pla tin) is a chemotherapy drug. It targets fast dividing cells, like cancer cells, and causes these cells to die. This medicine is used to treat ovarian cancer and many other cancers. This medicine may be used for other purposes; ask your health care provider or pharmacist if you have questions. COMMON BRAND NAME(S): Paraplatin What should I tell my health care provider before I take this medicine? They need to know if you have any of these conditions: -blood disorders -hearing problems -kidney disease -recent or ongoing radiation therapy -an unusual or allergic reaction to carboplatin, cisplatin, other chemotherapy, other medicines, foods, dyes, or preservatives -pregnant or trying to get pregnant -breast-feeding How should I use this medicine? This drug is usually given as an infusion into a vein. It is administered in a hospital or clinic by a specially trained health care professional. Talk to your pediatrician regarding the use of this medicine in children. Special care may be needed. Overdosage: If you think you have taken too much of this medicine contact a poison control center or emergency room at once. NOTE: This medicine is only for you. Do not share this medicine with others. What if I miss a dose? It is important not to miss a dose. Call your doctor or health care professional if you are unable to keep an appointment. What may interact with this medicine? -medicines for seizures -medicines to increase blood counts like filgrastim, pegfilgrastim, sargramostim -some antibiotics like amikacin, gentamicin, neomycin, streptomycin, tobramycin -vaccines Talk to your doctor or health  care professional before taking any of these medicines: -acetaminophen -aspirin -ibuprofen -ketoprofen -naproxen This list may not describe all possible interactions. Give your health care provider a list of all the medicines, herbs, non-prescription drugs, or dietary supplements you use. Also tell them if you smoke, drink alcohol, or use illegal drugs. Some items may interact with your medicine. What should I watch for while using this medicine? Your condition will be monitored carefully while you are receiving this medicine. You will need important blood work done while you are taking this medicine. This drug may make you feel generally unwell. This is not uncommon, as chemotherapy can affect healthy cells as well as cancer cells. Report any side effects. Continue your course of treatment even though you feel ill unless your doctor tells you to stop. In some cases, you may be given additional medicines to help with side effects. Follow all directions for their use. Call your doctor or health care professional for advice if you get a fever, chills or sore throat, or other symptoms of a cold or flu. Do not treat yourself. This drug decreases your body's ability to fight infections. Try to avoid being around people who are sick. This medicine may increase your risk to bruise or bleed. Call your doctor or health care professional if you notice any unusual bleeding. Be careful brushing and flossing your teeth or using a toothpick because you may get an infection or bleed more easily. If you have any dental work done, tell your dentist you   are receiving this medicine. Avoid taking products that contain aspirin, acetaminophen, ibuprofen, naproxen, or ketoprofen unless instructed by your doctor. These medicines may hide a fever. Do not become pregnant while taking this medicine. Women should inform their doctor if they wish to become pregnant or think they might be pregnant. There is a potential for serious  side effects to an unborn child. Talk to your health care professional or pharmacist for more information. Do not breast-feed an infant while taking this medicine. What side effects may I notice from receiving this medicine? Side effects that you should report to your doctor or health care professional as soon as possible: -allergic reactions like skin rash, itching or hives, swelling of the face, lips, or tongue -signs of infection - fever or chills, cough, sore throat, pain or difficulty passing urine -signs of decreased platelets or bleeding - bruising, pinpoint red spots on the skin, black, tarry stools, nosebleeds -signs of decreased red blood cells - unusually weak or tired, fainting spells, lightheadedness -breathing problems -changes in hearing -changes in vision -chest pain -high blood pressure -low blood counts - This drug may decrease the number of white blood cells, red blood cells and platelets. You may be at increased risk for infections and bleeding. -nausea and vomiting -pain, swelling, redness or irritation at the injection site -pain, tingling, numbness in the hands or feet -problems with balance, talking, walking -trouble passing urine or change in the amount of urine Side effects that usually do not require medical attention (report to your doctor or health care professional if they continue or are bothersome): -hair loss -loss of appetite -metallic taste in the mouth or changes in taste This list may not describe all possible side effects. Call your doctor for medical advice about side effects. You may report side effects to FDA at 1-800-FDA-1088. Where should I keep my medicine? This drug is given in a hospital or clinic and will not be stored at home. NOTE: This sheet is a summary. It may not cover all possible information. If you have questions about this medicine, talk to your doctor, pharmacist, or health care provider.  2018 [employer]/Gold Standard (2007-10-04  14:38:05)   

## 2016-12-08 ENCOUNTER — Telehealth: Payer: Self-pay

## 2016-12-08 ENCOUNTER — Other Ambulatory Visit: Payer: Self-pay | Admitting: Hematology and Oncology

## 2016-12-08 NOTE — Telephone Encounter (Signed)
-----   Message from Sherril Croon, RN sent at 12/04/2016  4:28 PM EDT ----- Regarding: Chemo Follow Up [facility]  1st taxol carboplatin

## 2016-12-08 NOTE — Telephone Encounter (Signed)
Called and spoke with daughter to see how patient is doing. She said her mom had some abdominal discomfort over the week end, that has resolved. Her mother is just feeling a little tired. Instructed to call for any problems.

## 2016-12-10 ENCOUNTER — Telehealth: Payer: Self-pay

## 2016-12-10 NOTE — Telephone Encounter (Signed)
Daughter called asking if pt can take ibuprofen for body aches. Instructed her yes. Also to use heat or hot shower. She is eating well and sleeping well and drinking well. She is fatigued and achey from chemo. No fever.

## 2016-12-15 ENCOUNTER — Telehealth: Payer: Self-pay | Admitting: Genetic Counselor

## 2016-12-15 ENCOUNTER — Encounter: Payer: Self-pay | Admitting: Genetic Counselor

## 2016-12-15 DIAGNOSIS — Z1379 Encounter for other screening for genetic and chromosomal anomalies: Secondary | ICD-10-CM | POA: Insufficient documentation

## 2016-12-15 NOTE — Telephone Encounter (Signed)
Revealed negative genetic testing.  Discussed that we do not know why her mother has ovarian cancer or why there is cancer in the family. It could be due to a different gene that we are not testing, or maybe our current technology may not be able to pick something up.  It will be important for her to keep in contact with genetics to keep up with whether additional testing may be needed.  Discussed that there is HRD testing on the ovarian tumor that is still outstanding.  We will call with these results at a later time.  We revealed that there are two VUS, one in [place] and the other in [place].  Reviewed VUS and that we do not change medical management, or offer testing to others based on these results.  We will let them know when they have been reclassified.

## 2016-12-23 ENCOUNTER — Telehealth: Payer: Self-pay | Admitting: Genetic Counselor

## 2016-12-23 NOTE — Telephone Encounter (Signed)
LM that results were back and that I would try to reach her on Friday.

## 2016-12-23 NOTE — Telephone Encounter (Signed)
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  This suggests that her cancer should respond well to PARP inhibitors .

## 2016-12-25 ENCOUNTER — Ambulatory Visit: Payer: Self-pay | Admitting: Genetic Counselor

## 2016-12-25 ENCOUNTER — Ambulatory Visit: Payer: BLUE CROSS/BLUE SHIELD | Admitting: Hematology and Oncology

## 2016-12-25 ENCOUNTER — Ambulatory Visit: Payer: BLUE CROSS/BLUE SHIELD

## 2016-12-25 ENCOUNTER — Other Ambulatory Visit: Payer: BLUE CROSS/BLUE SHIELD

## 2016-12-25 DIAGNOSIS — Z803 Family history of malignant neoplasm of breast: Secondary | ICD-10-CM

## 2016-12-25 DIAGNOSIS — Z1379 Encounter for other screening for genetic and chromosomal anomalies: Secondary | ICD-10-CM

## 2016-12-25 DIAGNOSIS — C5702 Malignant neoplasm of left fallopian tube: Secondary | ICD-10-CM

## 2016-12-25 NOTE — Progress Notes (Signed)
HPI: Ms. Soden was previously seen in the [place] clinic due to a personal and family history of cancer and concerns regarding a hereditary predisposition to cancer. Please refer to our prior cancer genetics clinic note for more information regarding Ms. Milford's medical, social and family histories, and our assessment and recommendations, at the time. Ms. Paske recent genetic test results were disclosed to her, as were recommendations warranted by these results. These results and recommendations are discussed in more detail below.  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 Talbert Nan after she started to have vaginal discharge. Pelvic US showed: 7.4 x 6.4 x 6.6 cm solid mass in the left adnexa with irregular borders and blood flow. The Pulsatile index is <1 and the resistive index is <0.4. There is a large amount of pelvic fluid. Concerning for possible ovarian cancer.        08/27/2016 Tumor Marker    Patient's tumor was tested for the following markers: CA125. Results of the tumor marker test revealed 1577      09/09/2016 Pathology Results    Primary Tumor Site(s):Left fallopian tube Tumor Size:Greatest dimension (cm): 3.5 cm Histologic Type:Serous high-grade carcinoma Histologic Grade:G3: Poorly differentiated  Ovarian Surface Involvement:Present  Specimen(s):Right ovary  Right Ovary - Extent:Not involved  Specimen(s):Left ovary  Left Ovary - Extent:Involved  Specimen(s):Right fallopian tube  Right Fallopian Tube - Extent:Not involved  Specimen(s):Left fallopian tube  Left Fallopian Tube - Extent:Involved  Specimen(s):Omentum  Omentum - Extent:Not involved   Specimen(s):Uterus  Uterus - Extent:Not involved  Specimen(s):Cervix  Cervix - Extent:Not involved  Specimen(s):Peritoneum  Peritoneum - Extent:Not involved  Peritoneal Ascitic Fluid:Negative for malignancy (normal / benign)  Pleural Fluid:Not performed / unknown  LYMPH NODES  Pelvic Lymph Nodes:  Number of Pelvic Lymph Nodes Examined:Specify number: 17  Number of Pelvic Lymph Nodes Involved:None identified  Para-aortic Lymph Nodes:  Number of Para-aortic Lymph Nodes Examined:Specify number: 2  Number of Para-aortic Lymph Nodes Involved:None identified  STAGE (pTNM, AJCC 7th ed.)  Primary Tumor (pT):Fallopian tube  Fallopian Tube Primary Tumor (pT):pT2a: Extension and / or metastasis to the uterus and / or ovaries  Regional Lymph Nodes (pN):pN0: No regional lymph node metastasis  Distant Metastasis (pM):Not applicable - pM cannot be determined from the submitted specimen(s)  FIGO STAGE (FIGO 2014)  FIGO Stage:IIA: Extension and / or implants on the uterus and / or fallopian tubes and / or ovaries      09/09/2016 Pathology Results    Pelvic fluid, washing: No definitive malignant cells identified. Blood, histiocytes, and reactive mesothelial cells present. Mixed inflammation present. See comment.      09/09/2016 Surgery    Procedures: Bilateral - Robotic Laparoscopy, Surgical; With Bilateral Total Pelvic Lymphadenectomy Peri-Aortic Lymph Node Sample, Sgl/Mult Robotic Bilateral Salpingo-Oophorectomy W/Omentectomy, Total Abd Hysterectomy & Radical Dissection For Debulking;    Findings:  1. 9-10 cm left adnexal mass with rupture of tumor during manipulation 2. Dilated left fallopian tube removed in tact with IOFS: poorly differentiated carcinoma 3. Endometriosis implants on anterior and posterior cul-de-sac 4. Grossly normal omentum,  diaphragm, small bowel mesenter 5. Grossly normal uterus and right fallopian tube/ovary 6. IOFS: anterior peritoneal biopsy (endometriosis) // left fallopian tube: poorly differentiated carcinoma 7. 1-2 cm tumor nodule in posterior cul-de-sac;  completely resected 8. Non-enlarged lymph nodes 9. R0 resection at end of case 10. Ascites present      11/30/2016 Imaging    1. Nonspecific trace fluid in the pelvic cul-de-sac. No discrete mass, adenopathy or other specific finding of local tumor recurrence in the pelvis. 2. No evidence of metastatic disease in the chest or abdomen.      12/01/2016 Procedure    Successful placement of a right IJ approach Power Port with ultrasound and fluoroscopic guidance. The catheter is ready for use      12/02/2016 Tumor Marker    Patient's tumor was tested for the following markers: CA125 Results of the tumor marker test revealed 7.8      12/21/2016 Genetic Testing    CDKN2A (p16INK4a) c.413G>A (p.Arg138Lys) and MLH1 c.290A>G (p.Tyr97Cys) VUS found on genetic testing on the [facility] panel.  The [facility] gene panel offered by [employer] includes sequencing and deletion/duplication testing of the following 28 genes: APC, ATM, BARD1, BMPR1A, BRCA1, BRCA2, BRIP1, CHD1, CDK4, CDKN2A, CHEK2, EPCAM (large rearrangement only), MLH1, MSH2, MSH6, MUTYH, NBN, PALB2, PMS2, PTEN, RAD51C, RAD51D, SMAD4, STK11, and TP53. Sequencing was performed for select regions of POLE and POLD1, and large rearrangement analysis was performed for select regions of GREM1. The report date is December 11, 2016.  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  HRD can be indicated by the presence of a tumor BRCA1 or BRCA2 mutation and/or genomic instability. HRD testing found genomic instability.  The report date is December 21, 2016.         FAMILY HISTORY:  We obtained a detailed, 4-generation family history.  Significant diagnoses are listed below: Family History  Problem  Relation Age of Onset  . Hypertension Mother   . Thyroid cancer Maternal Uncle   . Breast cancer Cousin        maternal 1/2 uncle's daughter dx under 82   The patient has two children who are cancer free.  She has three brothers and they are cancer free.  Her parents are alive in their 32's and do not have cancer.  Her mother has one full brother who died of a heart attack.  She has three paternal half brothers and a paternal half sister.  One half brother died of thyroid cancer.  Another brother has a daughter dx with breast cancer under 50.  Her father had two sisters and two brothers who are cancer free.  There is no known cancer history on the paternal side.  Ms. Digiulio is unaware of previous family history of genetic testing for hereditary cancer risks. Patient's maternal ancestors are of [place] descent, and paternal ancestors are of [place] descent. There is no reported Ashkenazi Jewish ancestry. There is no known consanguinity.  GENETIC TEST RESULTS: Genetic testing reported out on December 11, 2016 through the [facility] cancer panel found no deleterious mutations.  The [facility] gene panel offered by [employer] includes sequencing and deletion/duplication testing of the following 28 genes: APC, ATM, BARD1, BMPR1A, BRCA1, BRCA2, BRIP1, CHD1, CDK4, CDKN2A, CHEK2, EPCAM (large rearrangement only), MLH1, MSH2, MSH6, MUTYH, NBN, PALB2, PMS2, PTEN, RAD51C, RAD51D, SMAD4, STK11, and TP53. Sequencing was performed for select regions of POLE and POLD1, and large rearrangement analysis was performed for select regions of GREM1.   The test report has been scanned into EPIC and is located under the Molecular Pathology section of the Results Review tab.   We discussed with Ms. Mui that since the current genetic testing is not  perfect, it is possible there may be a gene mutation in one of these genes that current testing cannot detect, but that chance is small. We also discussed,  that it is possible that another gene that has not yet been discovered, or that we have not yet tested, is responsible for the cancer diagnoses in the family, and it is, therefore, important to remain in touch with cancer genetics in the future so that we can continue to offer Ms. Gombos the most up to date genetic testing.   HRD testing was performed and found that there were no deleterious BRCA1 or BRCA2 mutations within the tumor, but that genomics instability was positive.  Homologous recombination deficiency (HRD) can be indicated by the presence of a tumor BRCA1 or BRCA2 mutation and/or genomic instability.  The genomic instability status is a measurement of three biomarkers (loss of heterozygosity, telomeric allelic imbalance and large-scale state transition. This testing suggests that this patient would respond better than average to PARP inhibitors.  Genetic testing did detect two Variants of Unknown Significance - one in the CDKN2A (p16INK4a) gene called c.413G>A (p.Arg138Lys) and the other in the MLH1 gene called c.290A>G (p.Tyr97Cys). At this time, it is unknown if these variants are associated with increased cancer risk or if this is a normal finding, but most variants such as this get reclassified to being inconsequential. They should not be used to make medical management decisions. With time, we suspect the lab will determine the significance of these variants, if any. If we do learn more about them, we will try to contact Ms. Decatur to discuss it further. However, it is important to stay in touch with [place] periodically and keep the address and phone number up to date.      CANCER SCREENING RECOMMENDATIONS: This result is reassuring and indicates that Ms. Dohrmann likely does not have an increased risk for a future cancer due to a mutation in one of these genes. This normal test also suggests that Ms. Tunison's cancer was most likely not due to an inherited predisposition associated with one of  these genes.  Most cancers happen by chance and this negative test suggests that her cancer falls into this category.  We, therefore, recommended she continue to follow the cancer management and screening guidelines provided by her oncology and primary healthcare provider.   RECOMMENDATIONS FOR FAMILY MEMBERS: Women in this family might be at some increased risk of developing cancer, over the general population risk, simply due to the family history of cancer. We recommended women in this family have a yearly mammogram beginning at age 72, or 74 years younger than the earliest onset of cancer, an annual clinical breast exam, and perform monthly breast self-exams. Women in this family should also have a gynecological exam as recommended by their primary provider. All family members should have a colonoscopy by age 58.  FOLLOW-UP: Lastly, we discussed with Ms. Scheuermann that cancer genetics is a rapidly advancing field and it is possible that new genetic tests will be appropriate for her and/or her family members in the future. We encouraged her to remain in contact with cancer genetics on an annual basis so we can update her personal and family histories and let her know of advances in cancer genetics that may benefit this family.   Our contact number was provided. Ms. Nakayama questions were answered to her satisfaction, and she knows she is welcome to call us at anytime with additional questions or concerns.   Roma Kayser,  MS, [facility] Certified Genetic Counselor [email or web address]

## 2016-12-28 ENCOUNTER — Ambulatory Visit: Payer: BLUE CROSS/BLUE SHIELD

## 2016-12-28 ENCOUNTER — Ambulatory Visit (HOSPITAL_BASED_OUTPATIENT_CLINIC_OR_DEPARTMENT_OTHER): Payer: BLUE CROSS/BLUE SHIELD

## 2016-12-28 ENCOUNTER — Telehealth: Payer: Self-pay | Admitting: Hematology and Oncology

## 2016-12-28 ENCOUNTER — Other Ambulatory Visit (HOSPITAL_BASED_OUTPATIENT_CLINIC_OR_DEPARTMENT_OTHER): Payer: BLUE CROSS/BLUE SHIELD

## 2016-12-28 ENCOUNTER — Encounter: Payer: Self-pay | Admitting: Hematology and Oncology

## 2016-12-28 ENCOUNTER — Ambulatory Visit (HOSPITAL_BASED_OUTPATIENT_CLINIC_OR_DEPARTMENT_OTHER): Payer: BLUE CROSS/BLUE SHIELD | Admitting: Hematology and Oncology

## 2016-12-28 ENCOUNTER — Other Ambulatory Visit: Payer: Self-pay | Admitting: Hematology and Oncology

## 2016-12-28 DIAGNOSIS — Z1379 Encounter for other screening for genetic and chromosomal anomalies: Secondary | ICD-10-CM

## 2016-12-28 DIAGNOSIS — C5702 Malignant neoplasm of left fallopian tube: Secondary | ICD-10-CM

## 2016-12-28 DIAGNOSIS — K5909 Other constipation: Secondary | ICD-10-CM | POA: Diagnosis not present

## 2016-12-28 DIAGNOSIS — Z5111 Encounter for antineoplastic chemotherapy: Secondary | ICD-10-CM

## 2016-12-28 DIAGNOSIS — D509 Iron deficiency anemia, unspecified: Secondary | ICD-10-CM

## 2016-12-28 LAB — COMPREHENSIVE METABOLIC PANEL
ALK PHOS: 198 U/L — AB (ref 40–150)
ALT: 58 U/L — ABNORMAL HIGH (ref 0–55)
ANION GAP: 11 meq/L (ref 3–11)
AST: 53 U/L — ABNORMAL HIGH (ref 5–34)
Albumin: 4.1 g/dL (ref 3.5–5.0)
BILIRUBIN TOTAL: 0.35 mg/dL (ref 0.20–1.20)
BUN: 12.1 mg/dL (ref 7.0–26.0)
CO2: 24 meq/L (ref 22–29)
Calcium: 10.2 mg/dL (ref 8.4–10.4)
Chloride: 106 mEq/L (ref 98–109)
Creatinine: 0.8 mg/dL (ref 0.6–1.1)
Glucose: 122 mg/dl (ref 70–140)
POTASSIUM: 4.1 meq/L (ref 3.5–5.1)
Sodium: 142 mEq/L (ref 136–145)
TOTAL PROTEIN: 8.1 g/dL (ref 6.4–8.3)

## 2016-12-28 LAB — CBC WITH DIFFERENTIAL/PLATELET
BASO%: 0.1 % (ref 0.0–2.0)
BASOS ABS: 0 10*3/uL (ref 0.0–0.1)
EOS ABS: 0 10*3/uL (ref 0.0–0.5)
EOS%: 0 % (ref 0.0–7.0)
HCT: 35.9 % (ref 34.8–46.6)
HGB: 10.8 g/dL — ABNORMAL LOW (ref 11.6–15.9)
LYMPH%: 5.8 % — AB (ref 14.0–49.7)
MCH: 22.8 pg — AB (ref 25.1–34.0)
MCHC: 30.1 g/dL — ABNORMAL LOW (ref 31.5–36.0)
MCV: 75.9 fL — AB (ref 79.5–101.0)
MONO#: 0 10*3/uL — AB (ref 0.1–0.9)
MONO%: 0.4 % (ref 0.0–14.0)
NEUT%: 93.7 % — AB (ref 38.4–76.8)
NEUTROS ABS: 7.2 10*3/uL — AB (ref 1.5–6.5)
PLATELETS: 228 10*3/uL (ref 145–400)
RBC: 4.73 10*6/uL (ref 3.70–5.45)
RDW: 22 % — ABNORMAL HIGH (ref 11.2–14.5)
WBC: 7.7 10*3/uL (ref 3.9–10.3)
lymph#: 0.5 10*3/uL — ABNORMAL LOW (ref 0.9–3.3)

## 2016-12-28 MED ORDER — DEXAMETHASONE 4 MG PO TABS: ORAL_TABLET | ORAL | 1 refills | Status: DC

## 2016-12-28 MED ORDER — FAMOTIDINE IN NACL 20-0.9 MG/50ML-% IV SOLN
INTRAVENOUS | Status: AC
  Filled 2016-12-28: qty 50

## 2016-12-28 MED ORDER — SODIUM CHLORIDE 0.9% FLUSH
10.0000 mL | Freq: Once | INTRAVENOUS | Status: AC
  Administered 2016-12-28: 10 mL
  Filled 2016-12-28: qty 10

## 2016-12-28 MED ORDER — PACLITAXEL CHEMO INJECTION 300 MG/50ML
175.0000 mg/m2 | Freq: Once | INTRAVENOUS | Status: AC
  Administered 2016-12-28: 276 mg via INTRAVENOUS
  Filled 2016-12-28: qty 46

## 2016-12-28 MED ORDER — PALONOSETRON HCL INJECTION 0.25 MG/5ML
INTRAVENOUS | Status: AC
  Filled 2016-12-28: qty 5

## 2016-12-28 MED ORDER — DIPHENHYDRAMINE HCL 50 MG/ML IJ SOLN
50.0000 mg | Freq: Once | INTRAMUSCULAR | Status: AC
  Administered 2016-12-28: 50 mg via INTRAVENOUS

## 2016-12-28 MED ORDER — SODIUM CHLORIDE 0.9 % IV SOLN
Freq: Once | INTRAVENOUS | Status: AC
  Administered 2016-12-28: 12:00:00 via INTRAVENOUS

## 2016-12-28 MED ORDER — HEPARIN SOD (PORK) LOCK FLUSH 100 UNIT/ML IV SOLN
500.0000 [IU] | Freq: Once | INTRAVENOUS | Status: DC | PRN
  Filled 2016-12-28: qty 5

## 2016-12-28 MED ORDER — ANTICOAGULANT SODIUM CITRATE 4% (200MG/5ML) IV SOLN
5.0000 mL | Freq: Once | Status: AC
  Administered 2016-12-28: 5 mL
  Filled 2016-12-28: qty 5

## 2016-12-28 MED ORDER — SODIUM CHLORIDE 0.9 % IV SOLN
596.4000 mg | Freq: Once | INTRAVENOUS | Status: AC
  Administered 2016-12-28: 600 mg via INTRAVENOUS
  Filled 2016-12-28: qty 60

## 2016-12-28 MED ORDER — SODIUM CHLORIDE 0.9 % IV SOLN
20.0000 mg | Freq: Once | INTRAVENOUS | Status: AC
  Administered 2016-12-28: 20 mg via INTRAVENOUS
  Filled 2016-12-28: qty 2

## 2016-12-28 MED ORDER — PALONOSETRON HCL INJECTION 0.25 MG/5ML
0.2500 mg | Freq: Once | INTRAVENOUS | Status: AC
  Administered 2016-12-28: 0.25 mg via INTRAVENOUS

## 2016-12-28 MED ORDER — FAMOTIDINE IN NACL 20-0.9 MG/50ML-% IV SOLN
20.0000 mg | Freq: Once | INTRAVENOUS | Status: AC
  Administered 2016-12-28: 20 mg via INTRAVENOUS

## 2016-12-28 MED ORDER — DIPHENHYDRAMINE HCL 50 MG/ML IJ SOLN
INTRAMUSCULAR | Status: AC
  Filled 2016-12-28: qty 1

## 2016-12-28 MED ORDER — SODIUM CHLORIDE 0.9% FLUSH
10.0000 mL | INTRAVENOUS | Status: DC | PRN
  Administered 2016-12-28: 10 mL
  Filled 2016-12-28: qty 10

## 2016-12-28 MED ORDER — DEXAMETHASONE SODIUM PHOSPHATE 10 MG/ML IJ SOLN
INTRAMUSCULAR | Status: AC
  Filled 2016-12-28: qty 1

## 2016-12-28 NOTE — Assessment & Plan Note (Signed)
She tolerated cycle 1 of chemotherapy well with expected side effects such as fatigue, alopecia, mild constipation and mild myalgias We will proceed with cycle 2 of treatment without delay I reviewed with her the results of genetic testing, implications of additional screening of family members etc. The patient's son has collected all the information necessary and I addressed all his questions I reminded the patient to take premedications before each cycle of treatment

## 2016-12-28 NOTE — Telephone Encounter (Signed)
Appointments scheduled per 12/28/16 los.  Appointments split in 2 visits, per patient preference. (ok per Dr Alvy Bimler) Patient was given a copy of the AVS report and appointment schedule, per 12/28/16 los.

## 2016-12-28 NOTE — Patient Instructions (Signed)
Implanted Port Insertion  Implanted port insertion is a procedure to put in a port and catheter. The port is a device with an injectable disk that can be accessed by your health care provider. The port is connected to a vein in the chest or neck by a small flexible tube (catheter). There are different types of ports. The implanted port may be used as a long-term IV access for:  · Medicines, such as chemotherapy.  · Fluids.  · Liquid nutrition, such as total parenteral nutrition (TPN).  · Blood samples.    Having a port means that your health care provider will not need to use the veins in your arms for these procedures.  Tell a health care provider about:  · Any allergies you have.  · All medicines you are taking, especially blood thinners, as well as any vitamins, herbs, eye drops, creams, over-the-counter medicines, and steroids.  · Any problems you or family members have had with anesthetic medicines.  · Any blood disorders you have.  · Any surgeries you have had.  · Any medical conditions you have, including diabetes or kidney problems.  · Whether you are pregnant or may be pregnant.  What are the risks?  Generally, this is a safe procedure. However, problems may occur, including:  · Allergic reactions to medicines or dyes.  · Damage to other structures or organs.  · Infection.  · Damage to the blood vessel, bruising, or bleeding at the puncture site.  · Blood clot.  · Breakdown of the skin over the port.  · A collection of air in the chest that can cause one of the lungs to collapse (pneumothorax). This is rare.    What happens before the procedure?  Staying hydrated  Follow instructions from your health care provider about hydration, which may include:  · Up to 2 hours before the procedure - you may continue to drink clear liquids, such as water, clear fruit juice, black coffee, and plain tea.    Eating and drinking restrictions  · Follow instructions from your health care provider about eating and drinking,  which may include:  ? 8 hours before the procedure - stop eating heavy meals or foods such as meat, fried foods, or fatty foods.  ? 6 hours before the procedure - stop eating light meals or foods, such as toast or cereal.  ? 6 hours before the procedure - stop drinking milk or drinks that contain milk.  ? 2 hours before the procedure - stop drinking clear liquids.  Medicines  · Ask your health care provider about:  ? Changing or stopping your regular medicines. This is especially important if you are taking diabetes medicines or blood thinners.  ? Taking medicines such as aspirin and ibuprofen. These medicines can thin your blood. Do not take these medicines before your procedure if your health care provider instructs you not to.  · You may be given antibiotic medicine to help prevent infection.  General instructions  · Plan to have someone take you home from the hospital or clinic.  · If you will be going home right after the procedure, plan to have someone with you for 24 hours.  · You may have blood tests.  · You may be asked to shower with a germ-killing soap.  What happens during the procedure?  · To lower your risk of infection:  ? Your health care team will wash or sanitize their hands.  ? Your skin will be washed with   soap.  ? Hair may be removed from the surgical area.  · An IV tube will be inserted into one of your veins.  · You will be given one or more of the following:  ? A medicine to help you relax (sedative).  ? A medicine to numb the area (local anesthetic).  · Two small cuts (incisions) will be made to insert the port.  ? One incision will be made in your neck to get access to the vein where the catheter will lie.  ? The other incision will be made in the upper chest. This is where the port will lie.  · The procedure may be done using continuous X-ray (fluoroscopy) or other imaging tools for guidance.  · The port and catheter will be placed. There may be a small, raised area where the port  is.  · The port will be flushed with a salt solution (saline), and blood will be drawn to make sure that it is working correctly.  · The incisions will be closed.  · Bandages (dressings) may be placed over the incisions.  The procedure may vary among health care providers and hospitals.  What happens after the procedure?  · Your blood pressure, heart rate, breathing rate, and blood oxygen level will be monitored until the medicines you were given have worn off.  · Do not drive for 24 hours if you were given a sedative.  · You will be given a manufacturer's information card for the type of port that you have. Keep this with you.  · Your port will need to be flushed and checked as told by your health care provider, usually every few weeks.  · A chest X-ray will be done to:  ? Check the placement of the port.  ? Make sure there is no injury to your lung.  Summary  · Implanted port insertion is a procedure to put in a port and catheter.  · The implanted port is used as a long-term IV access.  · The port will need to be flushed and checked as told by your health care provider, usually every few weeks.  · Keep your manufacturer's information card with you at all times.  This information is not intended to replace advice given to you by your health care provider. Make sure you discuss any questions you have with your health care provider.  Document Released: 04/19/2013 Document Revised: 05/20/2016 Document Reviewed: 05/20/2016  [employer] Interactive Patient Education © 2017 [employer]

## 2016-12-28 NOTE — Progress Notes (Signed)
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 Talbert Nan after she started to have vaginal discharge. Pelvic US showed: 7.4 x 6.4 x 6.6 cm solid mass in the left adnexa with irregular borders and blood flow. The Pulsatile index is <1 and the resistive index is <0.4. There is a large amount of pelvic fluid. Concerning for possible ovarian cancer.        08/27/2016 Tumor Marker    Patient's tumor was tested for the following markers: CA125. Results of the tumor marker test revealed 1577      09/09/2016 Pathology Results    Primary Tumor Site(s):Left fallopian tube Tumor Size:Greatest dimension (cm): 3.5 cm Histologic Type:Serous high-grade carcinoma Histologic Grade:G3: Poorly differentiated  Ovarian Surface Involvement:Present  Specimen(s):Right ovary  Right Ovary - Extent:Not involved  Specimen(s):Left ovary  Left Ovary - Extent:Involved  Specimen(s):Right fallopian tube  Right Fallopian Tube - Extent:Not involved  Specimen(s):Left fallopian tube  Left Fallopian Tube - Extent:Involved  Specimen(s):Omentum  Omentum - Extent:Not involved  Specimen(s):Uterus  Uterus - Extent:Not involved  Specimen(s):Cervix  Cervix - Extent:Not involved  Specimen(s):Peritoneum  Peritoneum - Extent:Not involved  Peritoneal Ascitic Fluid:Negative for malignancy (normal / benign)  Pleural Fluid:Not performed / unknown  LYMPH NODES  Pelvic Lymph Nodes:  Number of Pelvic Lymph Nodes Examined:Specify  number: 17  Number of Pelvic Lymph Nodes Involved:None identified  Para-aortic Lymph Nodes:  Number of Para-aortic Lymph Nodes Examined:Specify number: 2  Number of Para-aortic Lymph Nodes Involved:None identified  STAGE (pTNM, AJCC 7th ed.)  Primary Tumor (pT):Fallopian tube  Fallopian Tube Primary Tumor (pT):pT2a: Extension and / or metastasis to the uterus and / or ovaries  Regional Lymph Nodes (pN):pN0: No regional lymph node metastasis  Distant Metastasis (pM):Not applicable - pM cannot be determined from the submitted specimen(s)  FIGO STAGE (FIGO 2014)  FIGO Stage:IIA: Extension and / or implants on the uterus and / or fallopian tubes and / or ovaries      09/09/2016 Pathology Results    Pelvic fluid, washing: No definitive malignant cells identified. Blood, histiocytes, and reactive mesothelial cells present. Mixed inflammation present. See comment.      09/09/2016 Surgery    Procedures: Bilateral - Robotic Laparoscopy, Surgical; With Bilateral Total Pelvic Lymphadenectomy Peri-Aortic Lymph Node Sample, Sgl/Mult Robotic Bilateral Salpingo-Oophorectomy W/Omentectomy, Total Abd Hysterectomy & Radical Dissection For Debulking;    Findings:  1. 9-10 cm left adnexal mass with rupture of tumor during manipulation 2. Dilated left fallopian tube removed in tact with IOFS: poorly differentiated carcinoma 3. Endometriosis implants on anterior and posterior cul-de-sac 4. Grossly normal omentum, diaphragm, small bowel mesenter 5. Grossly normal uterus and right fallopian tube/ovary 6. IOFS: anterior peritoneal biopsy (endometriosis) // left fallopian tube: poorly differentiated carcinoma 7. 1-2 cm tumor nodule in posterior cul-de-sac; completely resected 8. Non-enlarged lymph nodes 9. R0 resection at end of case 10. Ascites present      11/30/2016 Imaging    1. Nonspecific trace fluid in the pelvic cul-de-sac. No discrete  mass, adenopathy or other specific finding of local tumor recurrence in the pelvis. 2. No evidence of metastatic disease in the chest or abdomen.  12/01/2016 Procedure    Successful placement of a right IJ approach Power Port with ultrasound and fluoroscopic guidance. 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  The [facility] gene panel offered by [employer] includes sequencing and deletion/duplication testing of the following 28 genes: APC, ATM, BARD1, BMPR1A, BRCA1, BRCA2, BRIP1, CHD1, CDK4, CDKN2A, CHEK2, EPCAM (large rearrangement only), MLH1, MSH2, MSH6, MUTYH, NBN, PALB2, PMS2, PTEN, RAD51C, RAD51D, SMAD4, STK11, and TP53. Sequencing was performed for select regions of POLE and POLD1, and large rearrangement analysis was performed for select regions of GREM1. The report date is December 11, 2016.  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  HRD can be indicated by the presence of a tumor BRCA1 or BRCA2 mutation and/or genomic instability. HRD testing found genomic instability.  The report date is December 21, 2016.         INTERVAL HISTORY: Please see below for problem oriented charting. She returns for further follow-up She is aware of abnormal mutation noted on her genetic testing With cycle 1 of treatment, she developed some fatigue, alopecia and mild myalgias Denies peripheral neuropathy No recent nausea vomiting  REVIEW OF SYSTEMS:   Constitutional: Denies fevers, chills or abnormal weight loss Eyes: Denies blurriness of vision Ears, nose, mouth, throat, and face: Denies mucositis or sore throat Respiratory:  Denies cough, dyspnea or wheezes Cardiovascular: Denies palpitation, chest discomfort or lower extremity swelling Gastrointestinal:  Denies nausea, heartburn or change in bowel habits Skin: Denies abnormal skin rashes Lymphatics: Denies new lymphadenopathy or easy bruising Neurological:Denies numbness, tingling or new weaknesses Behavioral/Psych: Mood is stable, no new changes  All other systems were reviewed with the patient and are negative.  I have reviewed the past medical history, past surgical history, social history and family history with the patient and they are unchanged from previous note.  ALLERGIES:  is allergic to hydrocodone-acetaminophen.  MEDICATIONS:  Current Outpatient Prescriptions  Medication Sig Dispense Refill  . Cholecalciferol (VITAMIN D) 2000 units tablet Take one tablet twice daily.    Marland Kitchen dexamethasone (DECADRON) 4 MG tablet Take 5 tablets at 6 am on the day of treatment, every 3 weeks 30 tablet 1  . Ibuprofen (ADVIL PO) Take by mouth as needed.    . lidocaine-prilocaine (EMLA) cream Apply to affected area once 30 g 3  . ondansetron (ZOFRAN) 8 MG tablet Take 1 tablet (8 mg total) by mouth 2 (two) times daily as needed for refractory nausea / vomiting. Start on day 3 after chemo. 30 tablet 1  . ondansetron (ZOFRAN-ODT) 4 MG disintegrating tablet DIS ONE T PO Q 6 H PRN FOR UP TO 7 DAYS  0  . prochlorperazine (COMPAZINE) 10 MG tablet Take 1 tablet (10 mg total) by mouth every 6 (six) hours as needed (Nausea or vomiting). 30 tablet 1  . simethicone (MYLICON) 791 MG chewable tablet Chew 125 mg by mouth every 6 (six) hours as needed for flatulence.     No current facility-administered medications for this visit.     PHYSICAL EXAMINATION: ECOG PERFORMANCE STATUS: 1 - Symptomatic but completely ambulatory  Vitals:   12/28/16 0934  BP: 135/72  Pulse: 99  Resp: 18  Temp: 98.7 F (37.1 C)   Filed Weights   12/28/16 0934  Weight: 122 lb 11.2 oz (55.7 kg)     GENERAL:alert, no distress and comfortable SKIN: skin color, texture, turgor are normal, no rashes or significant lesions EYES: normal, Conjunctiva are pink and non-injected, sclera clear OROPHARYNX:no exudate, no erythema and lips, buccal mucosa, and tongue normal  NECK: supple, thyroid normal size, non-tender, without nodularity LYMPH:  no palpable lymphadenopathy in the cervical, axillary or inguinal LUNGS: clear to auscultation and percussion with normal breathing effort HEART: regular rate & rhythm and no murmurs and no lower extremity edema ABDOMEN:abdomen soft, non-tender and normal bowel sounds Musculoskeletal:no cyanosis of digits and no clubbing  NEURO: alert & oriented x 3 with fluent speech, no focal motor/sensory deficits  LABORATORY DATA:  I have reviewed the data as listed    Component Value Date/Time   NA 143 12/02/2016 1205   K 4.6 12/02/2016 1205   CL 103 12/01/2016 1242   CO2 28 12/02/2016 1205   GLUCOSE 95 12/02/2016 1205   BUN 7.9 12/02/2016 1205   CREATININE 0.8 12/02/2016 1205   CALCIUM 9.9 12/02/2016 1205   PROT 8.0 12/02/2016 1205   ALBUMIN 4.2 12/02/2016 1205   AST 21 12/02/2016 1205   ALT 20 12/02/2016 1205   ALKPHOS 107 12/02/2016 1205   BILITOT 0.45 12/02/2016 1205   GFRNONAA >60 12/01/2016 1242   GFRAA >60 12/01/2016 1242    No results found for: SPEP, UPEP  Lab Results  Component Value Date   WBC 7.7 12/28/2016   NEUTROABS 7.2 (H) 12/28/2016   HGB 10.8 (L) 12/28/2016   HCT 35.9 12/28/2016   MCV 75.9 (L) 12/28/2016   PLT 228 12/28/2016      Chemistry      Component Value Date/Time   NA 143 12/02/2016 1205   K 4.6 12/02/2016 1205   CL 103 12/01/2016 1242   CO2 28 12/02/2016 1205   BUN 7.9 12/02/2016 1205   CREATININE 0.8 12/02/2016 1205      Component Value Date/Time   CALCIUM 9.9 12/02/2016 1205   ALKPHOS 107 12/02/2016 1205   AST 21 12/02/2016 1205   ALT 20 12/02/2016 1205   BILITOT 0.45 12/02/2016 1205        RADIOGRAPHIC STUDIES: I have personally reviewed the radiological images as listed and agreed with the findings in the report. Ct Chest W Contrast  Result Date: 11/30/2016 CLINICAL DATA:  FIGO IIA left fallopian tube cancer diagnosed in February 2018 status post TAHBSO, omentectomy and radical dissection, presenting for restaging prior to adjuvant chemotherapy. EXAM: CT CHEST, ABDOMEN, AND PELVIS WITH CONTRAST TECHNIQUE: Multidetector CT imaging of the chest, abdomen and pelvis was performed following the standard protocol during bolus administration of intravenous contrast. CONTRAST:  166m ISOVUE-300 IOPAMIDOL (ISOVUE-300) INJECTION 61% COMPARISON:  Outside pelvic sonogram from 08/27/2016. FINDINGS: CT CHEST FINDINGS Cardiovascular: Normal heart size. No significant pericardial fluid/thickening. Great vessels are normal in course and caliber. No central pulmonary emboli. Mediastinum/Nodes: No discrete thyroid nodules. Unremarkable esophagus. No pathologically enlarged axillary, mediastinal or hilar lymph nodes. Lungs/Pleura: No pneumothorax. No pleural effusion. No acute consolidative airspace disease, lung masses or significant pulmonary nodules. Musculoskeletal: No aggressive appearing focal osseous lesions. Minimal thoracic spondylosis. CT ABDOMEN PELVIS FINDINGS Hepatobiliary: Normal liver with no liver mass. Normal gallbladder with no radiopaque cholelithiasis. No biliary ductal dilatation. Pancreas: Normal, with no mass or duct dilation.  Spleen: Normal size. No mass. Adrenals/Urinary Tract: Normal adrenals. Normal kidneys with no hydronephrosis and no renal mass. Relatively collapsed and grossly normal bladder. Stomach/Bowel: Grossly normal stomach. Normal caliber small bowel with no small bowel wall thickening. Candidate diminutive normal appendix with no pericecal inflammatory changes. Normal large bowel with no diverticulosis, large bowel wall thickening or pericolonic fat stranding.  Vascular/Lymphatic: Normal caliber abdominal aorta. Patent portal, splenic, hepatic and renal veins. No pathologically enlarged lymph nodes in the abdomen or pelvis. Reproductive: Status post hysterectomy, with no abnormal findings at the vaginal cuff. No adnexal mass. Other: No pneumoperitoneum. No focal fluid collection. Nonspecific trace fluid in the pelvic cul-de-sac. Subcentimeter soft tissue nodule anterior to the spleen is favored to represent a tiny splenule (series 2/image 47). Musculoskeletal: No aggressive appearing focal osseous lesions. Mild lumbar spondylosis. IMPRESSION: 1. Nonspecific trace fluid in the pelvic cul-de-sac. No discrete mass, adenopathy or other specific finding of local tumor recurrence in the pelvis. 2. No evidence of metastatic disease in the chest or abdomen. Electronically Signed   By: Ilona Sorrel M.D.   On: 11/30/2016 13:57   Ct Abdomen Pelvis W Contrast  Result Date: 11/30/2016 CLINICAL DATA:  FIGO IIA left fallopian tube cancer diagnosed in February 2018 status post TAHBSO, omentectomy and radical dissection, presenting for restaging prior to adjuvant chemotherapy. EXAM: CT CHEST, ABDOMEN, AND PELVIS WITH CONTRAST TECHNIQUE: Multidetector CT imaging of the chest, abdomen and pelvis was performed following the standard protocol during bolus administration of intravenous contrast. CONTRAST:  137m ISOVUE-300 IOPAMIDOL (ISOVUE-300) INJECTION 61% COMPARISON:  Outside pelvic sonogram from 08/27/2016. FINDINGS: CT CHEST FINDINGS Cardiovascular: Normal heart size. No significant pericardial fluid/thickening. Great vessels are normal in course and caliber. No central pulmonary emboli. Mediastinum/Nodes: No discrete thyroid nodules. Unremarkable esophagus. No pathologically enlarged axillary, mediastinal or hilar lymph nodes. Lungs/Pleura: No pneumothorax. No pleural effusion. No acute consolidative airspace disease, lung masses or significant pulmonary nodules. Musculoskeletal: No  aggressive appearing focal osseous lesions. Minimal thoracic spondylosis. CT ABDOMEN PELVIS FINDINGS Hepatobiliary: Normal liver with no liver mass. Normal gallbladder with no radiopaque cholelithiasis. No biliary ductal dilatation. Pancreas: Normal, with no mass or duct dilation. Spleen: Normal size. No mass. Adrenals/Urinary Tract: Normal adrenals. Normal kidneys with no hydronephrosis and no renal mass. Relatively collapsed and grossly normal bladder. Stomach/Bowel: Grossly normal stomach. Normal caliber small bowel with no small bowel wall thickening. Candidate diminutive normal appendix with no pericecal inflammatory changes. Normal large bowel with no diverticulosis, large bowel wall thickening or pericolonic fat stranding. Vascular/Lymphatic: Normal caliber abdominal aorta. Patent portal, splenic, hepatic and renal veins. No pathologically enlarged lymph nodes in the abdomen or pelvis. Reproductive: Status post hysterectomy, with no abnormal findings at the vaginal cuff. No adnexal mass. Other: No pneumoperitoneum. No focal fluid collection. Nonspecific trace fluid in the pelvic cul-de-sac. Subcentimeter soft tissue nodule anterior to the spleen is favored to represent a tiny splenule (series 2/image 47). Musculoskeletal: No aggressive appearing focal osseous lesions. Mild lumbar spondylosis. IMPRESSION: 1. Nonspecific trace fluid in the pelvic cul-de-sac. No discrete mass, adenopathy or other specific finding of local tumor recurrence in the pelvis. 2. No evidence of metastatic disease in the chest or abdomen. Electronically Signed   By: JIlona SorrelM.D.   On: 11/30/2016 13:57   Ir [place]Guide Vasc Access Right  Result Date: 12/01/2016 INDICATION: 51year old female with left fallopian tube cancer. She requires durable venous access for outpatient chemotherapy. EXAM: IMPLANTED PORT A CATH PLACEMENT WITH ULTRASOUND AND FLUOROSCOPIC GUIDANCE MEDICATIONS: 2  g Ancef; The antibiotic was administered within an  appropriate time interval prior to skin puncture. ANESTHESIA/SEDATION: Versed 4 mg IV; Fentanyl 100 mcg IV; Moderate Sedation Time:  30 minutes The patient was continuously monitored during the procedure by the interventional radiology nurse under my direct supervision. FLUOROSCOPY TIME:  2 minutes, 36 seconds (13 mGy) COMPLICATIONS: None immediate. PROCEDURE: The right neck and chest was prepped with chlorhexidine, and draped in the usual sterile fashion using maximum barrier technique (cap and mask, sterile gown, sterile gloves, large sterile sheet, hand hygiene and cutaneous antiseptic). Antibiotic prophylaxis was provided with 2g Ancef administered IV one hour prior to skin incision. Local anesthesia was attained by infiltration with 1% lidocaine with epinephrine. Ultrasound demonstrated patency of the right internal jugular vein, and this was documented with an image. Under real-time ultrasound guidance, this vein was accessed with a 21 gauge micropuncture needle and image documentation was performed. A small dermatotomy was made at the access site with an 11 scalpel. A 0.018" wire was advanced into the SVC and the access needle exchanged for a 76F micropuncture vascular sheath. The 0.018" wire was then removed and a 0.035" wire advanced into the IVC. An appropriate location for the subcutaneous reservoir was selected below the clavicle and an incision was made through the skin and underlying soft tissues. The subcutaneous tissues were then dissected using a combination of blunt and sharp surgical technique and a pocket was formed. A single lumen power injectable portacatheter was then tunneled through the subcutaneous tissues from the pocket to the dermatotomy and the port reservoir placed within the subcutaneous pocket. The venous access site was then serially dilated and a peel away vascular sheath placed over the wire. The wire was removed and the port catheter advanced into position under fluoroscopic  guidance. The catheter tip is positioned in the upper right atrium. This was documented with a spot image. The portacatheter was then tested and found to flush and aspirate well. The port was flushed with saline followed by 100 units/mL heparinized saline. The pocket was then closed in two layers using first subdermal inverted interrupted absorbable sutures followed by a running subcuticular suture. The epidermis was then sealed with Dermabond. The dermatotomy at the venous access site was also closed with a single inverted subdermal suture and the epidermis sealed with Dermabond. IMPRESSION: Successful placement of a right IJ approach Power Port with ultrasound and fluoroscopic guidance. The catheter is ready for use. Signed, Criselda Peaches, MD [facility] Radiology Electronically Signed   By: Jacqulynn Cadet M.D.   On: 12/01/2016 16:22   Ir Fluoro Guide Port Insertion Right  Result Date: 12/01/2016 INDICATION: 51 year old female with left fallopian tube cancer. She requires durable venous access for outpatient chemotherapy. EXAM: IMPLANTED PORT A CATH PLACEMENT WITH ULTRASOUND AND FLUOROSCOPIC GUIDANCE MEDICATIONS: 2 g Ancef; The antibiotic was administered within an appropriate time interval prior to skin puncture. ANESTHESIA/SEDATION: Versed 4 mg IV; Fentanyl 100 mcg IV; Moderate Sedation Time:  30 minutes The patient was continuously monitored during the procedure by the interventional radiology nurse under my direct supervision. FLUOROSCOPY TIME:  2 minutes, 36 seconds (13 mGy) COMPLICATIONS: None immediate. PROCEDURE: The right neck and chest was prepped with chlorhexidine, and draped in the usual sterile fashion using maximum barrier technique (cap and mask, sterile gown, sterile gloves, large sterile sheet, hand hygiene and cutaneous antiseptic). Antibiotic prophylaxis was provided with 2g Ancef administered IV one hour prior to skin incision.  Local anesthesia  was attained by infiltration with 1% lidocaine with epinephrine. Ultrasound demonstrated patency of the right internal jugular vein, and this was documented with an image. Under real-time ultrasound guidance, this vein was accessed with a 21 gauge micropuncture needle and image documentation was performed. A small dermatotomy was made at the access site with an 11 scalpel. A 0.018" wire was advanced into the SVC and the access needle exchanged for a 62F micropuncture vascular sheath. The 0.018" wire was then removed and a 0.035" wire advanced into the IVC. An appropriate location for the subcutaneous reservoir was selected below the clavicle and an incision was made through the skin and underlying soft tissues. The subcutaneous tissues were then dissected using a combination of blunt and sharp surgical technique and a pocket was formed. A single lumen power injectable portacatheter was then tunneled through the subcutaneous tissues from the pocket to the dermatotomy and the port reservoir placed within the subcutaneous pocket. The venous access site was then serially dilated and a peel away vascular sheath placed over the wire. The wire was removed and the port catheter advanced into position under fluoroscopic guidance. The catheter tip is positioned in the upper right atrium. This was documented with a spot image. The portacatheter was then tested and found to flush and aspirate well. The port was flushed with saline followed by 100 units/mL heparinized saline. The pocket was then closed in two layers using first subdermal inverted interrupted absorbable sutures followed by a running subcuticular suture. The epidermis was then sealed with Dermabond. The dermatotomy at the venous access site was also closed with a single inverted subdermal suture and the epidermis sealed with Dermabond. IMPRESSION: Successful placement of a right IJ approach Power Port with ultrasound and fluoroscopic guidance. The  catheter is ready for use. Signed, Criselda Peaches, MD [facility] Radiology Electronically Signed   By: Jacqulynn Cadet M.D.   On: 12/01/2016 16:22    ASSESSMENT & PLAN:  Cancer of left fallopian tube ([place]) She tolerated cycle 1 of chemotherapy well with expected side effects such as fatigue, alopecia, mild constipation and mild myalgias We will proceed with cycle 2 of treatment without delay I reviewed with her the results of genetic testing, implications of additional screening of family members etc. The patient's son has collected all the information necessary and I addressed all his questions I reminded the patient to take premedications before each cycle of treatment  Iron deficiency anemia She tolerated oral iron supplement well She will continue that for the next 3-4 months until resolution of anemia  Other constipation This could be related to side effects of Zofran and iron I recommend Senokot or MiraLAX daily   No orders of the defined types were placed in this encounter.  All questions were answered. The patient knows to call the clinic with any problems, questions or concerns. No barriers to learning was detected. I spent 15 minutes counseling the patient face to face. The total time spent in the appointment was 20 minutes and more than 50% was on counseling and review of test results     Heath Lark, MD 12/28/2016 10:00 AM

## 2016-12-28 NOTE — Patient Instructions (Signed)
[facility] Discharge Instructions for Patients Receiving Chemotherapy  Today you received the following chemotherapy agents Taxol and Carboplatin. To help prevent nausea and vomiting after your treatment, we encourage you to take your nausea medication as directed.  If you develop nausea and vomiting that is not controlled by your nausea medication, call the clinic.   BELOW ARE SYMPTOMS THAT SHOULD BE REPORTED IMMEDIATELY:  *FEVER GREATER THAN 100.5 F  *CHILLS WITH OR WITHOUT FEVER  NAUSEA AND VOMITING THAT IS NOT CONTROLLED WITH YOUR NAUSEA MEDICATION  *UNUSUAL SHORTNESS OF BREATH  *UNUSUAL BRUISING OR BLEEDING  TENDERNESS IN MOUTH AND THROAT WITH OR WITHOUT PRESENCE OF ULCERS  *URINARY PROBLEMS  *BOWEL PROBLEMS  UNUSUAL RASH Items with * indicate a potential emergency and should be followed up as soon as possible.  Feel free to call the clinic you have any questions or concerns. The clinic phone number is [phone].  Please show the CHEMO ALERT CARD at check-in to the Emergency Department and triage nurse.    

## 2016-12-28 NOTE — Assessment & Plan Note (Addendum)
This could be related to side effects of Zofran and iron I recommend Senokot or MiraLAX daily

## 2016-12-28 NOTE — Assessment & Plan Note (Signed)
She tolerated oral iron supplement well She will continue that for the next 3-4 months until resolution of anemia

## 2016-12-28 NOTE — Assessment & Plan Note (Signed)
We discussed results of genetic testing We also discussed implication to family members

## 2017-01-15 ENCOUNTER — Ambulatory Visit: Payer: BLUE CROSS/BLUE SHIELD | Admitting: Hematology and Oncology

## 2017-01-15 ENCOUNTER — Ambulatory Visit: Payer: BLUE CROSS/BLUE SHIELD

## 2017-01-15 ENCOUNTER — Other Ambulatory Visit: Payer: BLUE CROSS/BLUE SHIELD

## 2017-01-18 ENCOUNTER — Ambulatory Visit (HOSPITAL_BASED_OUTPATIENT_CLINIC_OR_DEPARTMENT_OTHER): Payer: BLUE CROSS/BLUE SHIELD | Admitting: Hematology and Oncology

## 2017-01-18 ENCOUNTER — Other Ambulatory Visit: Payer: Self-pay | Admitting: Hematology and Oncology

## 2017-01-18 ENCOUNTER — Other Ambulatory Visit (HOSPITAL_BASED_OUTPATIENT_CLINIC_OR_DEPARTMENT_OTHER): Payer: BLUE CROSS/BLUE SHIELD

## 2017-01-18 DIAGNOSIS — R748 Abnormal levels of other serum enzymes: Secondary | ICD-10-CM | POA: Diagnosis not present

## 2017-01-18 DIAGNOSIS — D509 Iron deficiency anemia, unspecified: Secondary | ICD-10-CM

## 2017-01-18 DIAGNOSIS — Z598 Other problems related to housing and economic circumstances: Secondary | ICD-10-CM

## 2017-01-18 DIAGNOSIS — C5702 Malignant neoplasm of left fallopian tube: Secondary | ICD-10-CM

## 2017-01-18 DIAGNOSIS — Z599 Problem related to housing and economic circumstances, unspecified: Secondary | ICD-10-CM

## 2017-01-18 LAB — CBC WITH DIFFERENTIAL/PLATELET
BASO%: 0.5 % (ref 0.0–2.0)
Basophils Absolute: 0 10*3/uL (ref 0.0–0.1)
EOS%: 0.2 % (ref 0.0–7.0)
Eosinophils Absolute: 0 10*3/uL (ref 0.0–0.5)
HEMATOCRIT: 35.1 % (ref 34.8–46.6)
HGB: 11.2 g/dL — ABNORMAL LOW (ref 11.6–15.9)
LYMPH#: 1.4 10*3/uL (ref 0.9–3.3)
LYMPH%: 20.6 % (ref 14.0–49.7)
MCH: 24.5 pg — ABNORMAL LOW (ref 25.1–34.0)
MCHC: 31.9 g/dL (ref 31.5–36.0)
MCV: 76.8 fL — ABNORMAL LOW (ref 79.5–101.0)
MONO#: 0.5 10*3/uL (ref 0.1–0.9)
MONO%: 7.3 % (ref 0.0–14.0)
NEUT%: 71.4 % (ref 38.4–76.8)
NEUTROS ABS: 4.7 10*3/uL (ref 1.5–6.5)
Platelets: 221 10*3/uL (ref 145–400)
RBC: 4.57 10*6/uL (ref 3.70–5.45)
RDW: 27.2 % — ABNORMAL HIGH (ref 11.2–14.5)
WBC: 6.6 10*3/uL (ref 3.9–10.3)

## 2017-01-18 LAB — COMPREHENSIVE METABOLIC PANEL
ALT: 142 U/L — AB (ref 0–55)
AST: 109 U/L — AB (ref 5–34)
Albumin: 3.9 g/dL (ref 3.5–5.0)
Alkaline Phosphatase: 268 U/L — ABNORMAL HIGH (ref 40–150)
Anion Gap: 9 mEq/L (ref 3–11)
BILIRUBIN TOTAL: 0.32 mg/dL (ref 0.20–1.20)
BUN: 10.5 mg/dL (ref 7.0–26.0)
CHLORIDE: 105 meq/L (ref 98–109)
CO2: 27 meq/L (ref 22–29)
CREATININE: 0.8 mg/dL (ref 0.6–1.1)
Calcium: 10.1 mg/dL (ref 8.4–10.4)
EGFR: >90 mL/min/{1.73_m2} (ref >90)
Glucose: 92 mg/dl (ref 70–140)
Potassium: 4.1 mEq/L (ref 3.5–5.1)
Sodium: 141 mEq/L (ref 136–145)
TOTAL PROTEIN: 8.1 g/dL (ref 6.4–8.3)

## 2017-01-18 MED ORDER — PROCHLORPERAZINE MALEATE 10 MG PO TABS
10.0000 mg | ORAL_TABLET | Freq: Four times a day (QID) | ORAL | 1 refills | Status: DC | PRN
Start: 2017-01-18 — End: 2017-08-12

## 2017-01-19 ENCOUNTER — Ambulatory Visit (HOSPITAL_BASED_OUTPATIENT_CLINIC_OR_DEPARTMENT_OTHER): Payer: BLUE CROSS/BLUE SHIELD

## 2017-01-19 ENCOUNTER — Telehealth: Payer: Self-pay | Admitting: Hematology and Oncology

## 2017-01-19 VITALS — BP 142/79 | HR 87 | Temp 98.1°F | Resp 18

## 2017-01-19 DIAGNOSIS — Z5111 Encounter for antineoplastic chemotherapy: Secondary | ICD-10-CM

## 2017-01-19 DIAGNOSIS — Z598 Other problems related to housing and economic circumstances: Secondary | ICD-10-CM | POA: Insufficient documentation

## 2017-01-19 DIAGNOSIS — Z599 Problem related to housing and economic circumstances, unspecified: Secondary | ICD-10-CM | POA: Insufficient documentation

## 2017-01-19 DIAGNOSIS — R748 Abnormal levels of other serum enzymes: Secondary | ICD-10-CM | POA: Insufficient documentation

## 2017-01-19 DIAGNOSIS — C5702 Malignant neoplasm of left fallopian tube: Secondary | ICD-10-CM

## 2017-01-19 MED ORDER — SODIUM CHLORIDE 0.9% FLUSH
10.0000 mL | INTRAVENOUS | Status: DC | PRN
  Administered 2017-01-19: 10 mL
  Filled 2017-01-19: qty 10

## 2017-01-19 MED ORDER — SODIUM CHLORIDE 0.9 % IV SOLN
20.0000 mg | Freq: Once | INTRAVENOUS | Status: AC
  Administered 2017-01-19: 20 mg via INTRAVENOUS
  Filled 2017-01-19: qty 2

## 2017-01-19 MED ORDER — HEPARIN SOD (PORK) LOCK FLUSH 100 UNIT/ML IV SOLN
500.0000 [IU] | Freq: Once | INTRAVENOUS | Status: AC | PRN
  Administered 2017-01-19: 500 [IU]
  Filled 2017-01-19: qty 5

## 2017-01-19 MED ORDER — SODIUM CHLORIDE 0.9 % IV SOLN
600.0000 mg | Freq: Once | INTRAVENOUS | Status: AC
  Administered 2017-01-19: 600 mg via INTRAVENOUS
  Filled 2017-01-19: qty 60

## 2017-01-19 MED ORDER — PACLITAXEL CHEMO INJECTION 300 MG/50ML
131.2500 mg/m2 | Freq: Once | INTRAVENOUS | Status: AC
  Administered 2017-01-19: 204 mg via INTRAVENOUS
  Filled 2017-01-19: qty 34

## 2017-01-19 MED ORDER — PALONOSETRON HCL INJECTION 0.25 MG/5ML
0.2500 mg | Freq: Once | INTRAVENOUS | Status: AC
  Administered 2017-01-19: 0.25 mg via INTRAVENOUS

## 2017-01-19 MED ORDER — FAMOTIDINE IN NACL 20-0.9 MG/50ML-% IV SOLN
INTRAVENOUS | Status: AC
  Filled 2017-01-19: qty 50

## 2017-01-19 MED ORDER — PALONOSETRON HCL INJECTION 0.25 MG/5ML
INTRAVENOUS | Status: AC
  Filled 2017-01-19: qty 5

## 2017-01-19 MED ORDER — DIPHENHYDRAMINE HCL 50 MG/ML IJ SOLN
INTRAMUSCULAR | Status: AC
  Filled 2017-01-19: qty 1

## 2017-01-19 MED ORDER — SODIUM CHLORIDE 0.9 % IV SOLN
Freq: Once | INTRAVENOUS | Status: AC
  Administered 2017-01-19: 09:00:00 via INTRAVENOUS

## 2017-01-19 MED ORDER — FAMOTIDINE IN NACL 20-0.9 MG/50ML-% IV SOLN
20.0000 mg | Freq: Once | INTRAVENOUS | Status: AC
  Administered 2017-01-19: 20 mg via INTRAVENOUS

## 2017-01-19 MED ORDER — DIPHENHYDRAMINE HCL 50 MG/ML IJ SOLN
50.0000 mg | Freq: Once | INTRAMUSCULAR | Status: AC
  Administered 2017-01-19: 50 mg via INTRAVENOUS

## 2017-01-19 NOTE — Telephone Encounter (Signed)
Added appointments for August per 7/9 schedule message. Left message for patient via [place] [phone] - [place]) re appointments. Patient to get new schedule at next visit 7/26.

## 2017-01-19 NOTE — Assessment & Plan Note (Signed)
She has elevated liver enzymes likely related to side effects of chemotherapy I have discussed with the pharmacist who felt that based on dosing guidelines dose adjustment is not necessary. However, having said that, so for safety reason, I plan to reduce a dose of chemotherapy a little bit cycle 3 and beyond I will also delay cycle 4 x 1 week to allow adequate time for recovery of her liver enzymes before we proceed with cycle 4 of therapy

## 2017-01-19 NOTE — Progress Notes (Signed)
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 Talbert Nan after she started to have vaginal discharge. Pelvic US showed: 7.4 x 6.4 x 6.6 cm solid mass in the left adnexa with irregular borders and blood flow. The Pulsatile index is <1 and the resistive index is <0.4. There is a large amount of pelvic fluid. Concerning for possible ovarian cancer.        08/27/2016 Tumor Marker    Patient's tumor was tested for the following markers: CA125. Results of the tumor marker test revealed 1577      09/09/2016 Pathology Results    Primary Tumor Site(s):Left fallopian tube Tumor Size:Greatest dimension (cm): 3.5 cm Histologic Type:Serous high-grade carcinoma Histologic Grade:G3: Poorly differentiated  Ovarian Surface Involvement:Present  Specimen(s):Right ovary  Right Ovary - Extent:Not involved  Specimen(s):Left ovary  Left Ovary - Extent:Involved  Specimen(s):Right fallopian tube  Right Fallopian Tube - Extent:Not involved  Specimen(s):Left fallopian tube  Left Fallopian Tube - Extent:Involved  Specimen(s):Omentum  Omentum - Extent:Not involved  Specimen(s):Uterus  Uterus - Extent:Not involved  Specimen(s):Cervix  Cervix - Extent:Not involved  Specimen(s):Peritoneum  Peritoneum - Extent:Not involved  Peritoneal Ascitic Fluid:Negative for malignancy (normal / benign)  Pleural Fluid:Not performed / unknown  LYMPH NODES  Pelvic Lymph Nodes:   Number of Pelvic Lymph Nodes Examined:Specify number: 17  Number of Pelvic Lymph Nodes Involved:None identified  Para-aortic Lymph Nodes:  Number of Para-aortic Lymph Nodes Examined:Specify number: 2  Number of Para-aortic Lymph Nodes Involved:None identified  STAGE (pTNM, AJCC 7th ed.)  Primary Tumor (pT):Fallopian tube  Fallopian Tube Primary Tumor (pT):pT2a: Extension and / or metastasis to the uterus and / or ovaries  Regional Lymph Nodes (pN):pN0: No regional lymph node metastasis  Distant Metastasis (pM):Not applicable - pM cannot be determined from the submitted specimen(s)  FIGO STAGE (FIGO 2014)  FIGO Stage:IIA: Extension and / or implants on the uterus and / or fallopian tubes and / or ovaries      09/09/2016 Pathology Results    Pelvic fluid, washing: No definitive malignant cells identified. Blood, histiocytes, and reactive mesothelial cells present. Mixed inflammation present. See comment.      09/09/2016 Surgery    Procedures: Bilateral - Robotic Laparoscopy, Surgical; With Bilateral Total Pelvic Lymphadenectomy Peri-Aortic Lymph Node Sample, Sgl/Mult Robotic Bilateral Salpingo-Oophorectomy W/Omentectomy, Total Abd Hysterectomy & Radical Dissection For Debulking;    Findings:  1. 9-10 cm left adnexal mass with rupture of tumor during manipulation 2. Dilated left fallopian tube removed in tact with IOFS: poorly differentiated carcinoma 3. Endometriosis implants on anterior and posterior cul-de-sac 4. Grossly normal omentum, diaphragm, small bowel mesenter 5. Grossly normal uterus and right fallopian tube/ovary 6. IOFS: anterior peritoneal biopsy (endometriosis) // left fallopian tube: poorly differentiated carcinoma 7. 1-2 cm tumor nodule in posterior cul-de-sac; completely resected 8. Non-enlarged lymph nodes 9. R0 resection at end of case 10. Ascites present      11/30/2016 Imaging    1. Nonspecific  trace fluid in the pelvic cul-de-sac. No discrete mass, adenopathy or other specific finding of local tumor recurrence in the pelvis. 2. No evidence  of metastatic disease in the chest or abdomen.      12/01/2016 Procedure    Successful placement of a right IJ approach Power Port with ultrasound and fluoroscopic guidance. 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  The [facility] gene panel offered by [employer] includes sequencing and deletion/duplication testing of the following 28 genes: APC, ATM, BARD1, BMPR1A, BRCA1, BRCA2, BRIP1, CHD1, CDK4, CDKN2A, CHEK2, EPCAM (large rearrangement only), MLH1, MSH2, MSH6, MUTYH, NBN, PALB2, PMS2, PTEN, RAD51C, RAD51D, SMAD4, STK11, and TP53. Sequencing was performed for select regions of POLE and POLD1, and large rearrangement analysis was performed for select regions of GREM1. The report date is December 11, 2016.  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  HRD can be indicated by the presence of a tumor BRCA1 or BRCA2 mutation and/or genomic instability. HRD testing found genomic instability.  The report date is December 21, 2016.        01/18/2017 Adverse Reaction    Dose of Taxol is reduced due to elevated liver enzymes       INTERVAL HISTORY: Please see below for problem oriented charting. She returns with her son.  Interpreter is present. She complain of significant fatigue. She also had myalgias and arthralgias and persistent hip pain that comes and goes She is tolerating iron therapy well Denies recent nausea or peripheral neuropathy. We discussed implication of being away from  work.  The patient does not have disability insurance or FMLA available through her workplace  REVIEW OF SYSTEMS:   Constitutional: Denies fevers, chills or abnormal weight loss Eyes: Denies blurriness of vision Ears, nose, mouth, throat, and face: Denies mucositis or sore throat Respiratory: Denies cough, dyspnea or wheezes Cardiovascular: Denies palpitation, chest discomfort or lower extremity swelling Gastrointestinal:  Denies nausea, heartburn or change in bowel habits Skin: Denies abnormal skin rashes Lymphatics: Denies new lymphadenopathy or easy bruising Neurological:Denies numbness, tingling or new weaknesses Behavioral/Psych: Mood is stable, no new changes  All other systems were reviewed with the patient and are negative.  I have reviewed the past medical history, past surgical history, social history and family history with the patient and they are unchanged from previous note.  ALLERGIES:  is allergic to hydrocodone-acetaminophen.  MEDICATIONS:  Current Outpatient Prescriptions  Medication Sig Dispense Refill  . Cholecalciferol (VITAMIN D) 2000 units tablet Take one tablet twice daily.    Marland Kitchen dexamethasone (DECADRON) 4 MG tablet Take 5 tablets at 6 am on the day of treatment, every 3 weeks 30 tablet 1  . Ibuprofen (ADVIL PO) Take by mouth as needed.    . lidocaine-prilocaine (EMLA) cream Apply to affected area once 30 g 3  . ondansetron (ZOFRAN) 8 MG tablet Take 1 tablet (8 mg total) by mouth 2 (two) times daily as needed for refractory nausea / vomiting. Start on day 3 after chemo. 30 tablet 1  . ondansetron (ZOFRAN-ODT) 4 MG disintegrating tablet DIS ONE T PO Q 6 H PRN FOR UP TO 7 DAYS  0  . prochlorperazine (COMPAZINE) 10 MG tablet Take 1 tablet (10 mg total) by mouth  every 6 (six) hours as needed (Nausea or vomiting). 60 tablet 1  . simethicone (MYLICON) 580 MG chewable tablet Chew 125 mg by mouth every 6 (six) hours as needed for flatulence.     No current  facility-administered medications for this visit.     PHYSICAL EXAMINATION: ECOG PERFORMANCE STATUS: 1 - Symptomatic but completely ambulatory  Vitals:   01/18/17 1003  BP: 133/70  Pulse: 76  Resp: 18  Temp: 98.6 F (37 C)   Filed Weights   01/18/17 1003  Weight: 125 lb 6.4 oz (56.9 kg)    GENERAL:alert, no distress and comfortable SKIN: skin color, texture, turgor are normal, no rashes or significant lesions EYES: normal, Conjunctiva are pink and non-injected, sclera clear OROPHARYNX:no exudate, no erythema and lips, buccal mucosa, and tongue normal  NECK: supple, thyroid normal size, non-tender, without nodularity LYMPH:  no palpable lymphadenopathy in the cervical, axillary or inguinal LUNGS: clear to auscultation and percussion with normal breathing effort HEART: regular rate & rhythm and no murmurs and no lower extremity edema ABDOMEN:abdomen soft, non-tender and normal bowel sounds Musculoskeletal:no cyanosis of digits and no clubbing  NEURO: alert & oriented x 3 with fluent speech, no focal motor/sensory deficits  LABORATORY DATA:  I have reviewed the data as listed    Component Value Date/Time   NA 141 01/18/2017 0938   K 4.1 01/18/2017 0938   CL 103 12/01/2016 1242   CO2 27 01/18/2017 0938   GLUCOSE 92 01/18/2017 0938   BUN 10.5 01/18/2017 0938   CREATININE 0.8 01/18/2017 0938   CALCIUM 10.1 01/18/2017 0938   PROT 8.1 01/18/2017 0938   ALBUMIN 3.9 01/18/2017 0938   AST 109 (H) 01/18/2017 0938   ALT 142 (H) 01/18/2017 0938   ALKPHOS 268 (H) 01/18/2017 0938   BILITOT 0.32 01/18/2017 0938   GFRNONAA >60 12/01/2016 1242   GFRAA >60 12/01/2016 1242    No results found for: SPEP, UPEP  Lab Results  Component Value Date   WBC 6.6 01/18/2017   NEUTROABS 4.7 01/18/2017   HGB 11.2 (L) 01/18/2017   HCT 35.1 01/18/2017   MCV 76.8 (L) 01/18/2017   PLT 221 01/18/2017      Chemistry      Component Value Date/Time   NA 141 01/18/2017 0938   K 4.1  01/18/2017 0938   CL 103 12/01/2016 1242   CO2 27 01/18/2017 0938   BUN 10.5 01/18/2017 0938   CREATININE 0.8 01/18/2017 0938      Component Value Date/Time   CALCIUM 10.1 01/18/2017 0938   ALKPHOS 268 (H) 01/18/2017 0938   AST 109 (H) 01/18/2017 0938   ALT 142 (H) 01/18/2017 0938   BILITOT 0.32 01/18/2017 0938       ASSESSMENT & PLAN:  Cancer of left fallopian tube ([place]) She tolerated chemotherapy well with expected side effects such as fatigue, alopecia, mild constipation, elevated LFT and mild myalgias We will proceed with cycle 3 of treatment without delay  Iron deficiency anemia She tolerated oral iron supplement well She will continue that for the next 3-4 months until resolution of anemia  Elevated liver enzymes She has elevated liver enzymes likely related to side effects of chemotherapy I have discussed with the pharmacist who felt that based on dosing guidelines dose adjustment is not necessary. However, having said that, so for safety reason, I plan to reduce a dose of chemotherapy a little bit cycle 3 and beyond I will also delay cycle 4 x 1 week to allow  adequate time for recovery of her liver enzymes before we proceed with cycle 4 of therapy  Financial difficulties We discussed possibility of application for Social Security and disability I recommend she inquire through human resources at work first before she proceed to get a lawyer involved to help her with navigation through financial difficulties   No orders of the defined types were placed in this encounter.  All questions were answered. The patient knows to call the clinic with any problems, questions or concerns. No barriers to learning was detected. I spent 20 minutes counseling the patient face to face. The total time spent in the appointment was 25 minutes and more than 50% was on counseling and review of test results     Heath Lark, MD 01/19/2017 6:54 AM

## 2017-01-19 NOTE — Assessment & Plan Note (Signed)
We discussed possibility of application for Social Security and disability I recommend she inquire through human resources at work first before she proceed to get a lawyer involved to help her with navigation through financial difficulties

## 2017-01-19 NOTE — Assessment & Plan Note (Signed)
She tolerated oral iron supplement well She will continue that for the next 3-4 months until resolution of anemia

## 2017-01-19 NOTE — Progress Notes (Signed)
Per Dr. Calton Dach note, okay to treat today with AST of 109 and ALT of 142.

## 2017-01-19 NOTE — Assessment & Plan Note (Signed)
She tolerated chemotherapy well with expected side effects such as fatigue, alopecia, mild constipation, elevated LFT and mild myalgias We will proceed with cycle 3 of treatment without delay

## 2017-01-19 NOTE — Patient Instructions (Signed)
[facility] Discharge Instructions for Patients Receiving Chemotherapy  Today you received the following chemotherapy agents Taxol and Carboplatin. To help prevent nausea and vomiting after your treatment, we encourage you to take your nausea medication as directed.  If you develop nausea and vomiting that is not controlled by your nausea medication, call the clinic.   BELOW ARE SYMPTOMS THAT SHOULD BE REPORTED IMMEDIATELY:  *FEVER GREATER THAN 100.5 F  *CHILLS WITH OR WITHOUT FEVER  NAUSEA AND VOMITING THAT IS NOT CONTROLLED WITH YOUR NAUSEA MEDICATION  *UNUSUAL SHORTNESS OF BREATH  *UNUSUAL BRUISING OR BLEEDING  TENDERNESS IN MOUTH AND THROAT WITH OR WITHOUT PRESENCE OF ULCERS  *URINARY PROBLEMS  *BOWEL PROBLEMS  UNUSUAL RASH Items with * indicate a potential emergency and should be followed up as soon as possible.  Feel free to call the clinic you have any questions or concerns. The clinic phone number is [phone].  Please show the CHEMO ALERT CARD at check-in to the Emergency Department and triage nurse.    

## 2017-02-04 ENCOUNTER — Ambulatory Visit (HOSPITAL_BASED_OUTPATIENT_CLINIC_OR_DEPARTMENT_OTHER): Payer: BLUE CROSS/BLUE SHIELD | Admitting: Hematology and Oncology

## 2017-02-04 ENCOUNTER — Other Ambulatory Visit (HOSPITAL_BASED_OUTPATIENT_CLINIC_OR_DEPARTMENT_OTHER): Payer: BLUE CROSS/BLUE SHIELD

## 2017-02-04 DIAGNOSIS — C5702 Malignant neoplasm of left fallopian tube: Secondary | ICD-10-CM | POA: Diagnosis not present

## 2017-02-04 DIAGNOSIS — D61818 Other pancytopenia: Secondary | ICD-10-CM

## 2017-02-04 DIAGNOSIS — R748 Abnormal levels of other serum enzymes: Secondary | ICD-10-CM | POA: Diagnosis not present

## 2017-02-04 LAB — COMPREHENSIVE METABOLIC PANEL
ALBUMIN: 3.8 g/dL (ref 3.5–5.0)
ALK PHOS: 201 U/L — AB (ref 40–150)
ALT: 71 U/L — ABNORMAL HIGH (ref 0–55)
AST: 58 U/L — ABNORMAL HIGH (ref 5–34)
Anion Gap: 10 mEq/L (ref 3–11)
BUN: 9.1 mg/dL (ref 7.0–26.0)
CALCIUM: 9.9 mg/dL (ref 8.4–10.4)
CO2: 28 mEq/L (ref 22–29)
Chloride: 104 mEq/L (ref 98–109)
Creatinine: 0.8 mg/dL (ref 0.6–1.1)
Glucose: 91 mg/dl (ref 70–140)
POTASSIUM: 4.7 meq/L (ref 3.5–5.1)
Sodium: 141 mEq/L (ref 136–145)
Total Bilirubin: <0.22 mg/dL (ref 0.20–1.20)
Total Protein: 7.8 g/dL (ref 6.4–8.3)

## 2017-02-04 LAB — CBC WITH DIFFERENTIAL/PLATELET
BASO%: 0.7 % (ref 0.0–2.0)
Basophils Absolute: 0 10*3/uL (ref 0.0–0.1)
EOS ABS: 0 10*3/uL (ref 0.0–0.5)
EOS%: 0.8 % (ref 0.0–7.0)
HEMATOCRIT: 34.2 % — AB (ref 34.8–46.6)
HEMOGLOBIN: 10.9 g/dL — AB (ref 11.6–15.9)
LYMPH#: 1.2 10*3/uL (ref 0.9–3.3)
LYMPH%: 55.5 % — AB (ref 14.0–49.7)
MCH: 25.5 pg (ref 25.1–34.0)
MCHC: 31.9 g/dL (ref 31.5–36.0)
MCV: 79.9 fL (ref 79.5–101.0)
MONO#: 0.4 10*3/uL (ref 0.1–0.9)
MONO%: 16.9 % — ABNORMAL HIGH (ref 0.0–14.0)
NEUT%: 26.1 % — ABNORMAL LOW (ref 38.4–76.8)
NEUTROS ABS: 0.5 10*3/uL — AB (ref 1.5–6.5)
PLATELETS: 207 10*3/uL (ref 145–400)
RBC: 4.28 10*6/uL (ref 3.70–5.45)
RDW: 28 % — AB (ref 11.2–14.5)
WBC: 2.1 10*3/uL — AB (ref 3.9–10.3)

## 2017-02-05 ENCOUNTER — Encounter: Payer: Self-pay | Admitting: Hematology and Oncology

## 2017-02-05 DIAGNOSIS — D61818 Other pancytopenia: Secondary | ICD-10-CM | POA: Insufficient documentation

## 2017-02-05 NOTE — Progress Notes (Signed)
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 Talbert Nan after she started to have vaginal discharge. Pelvic US showed: 7.4 x 6.4 x 6.6 cm solid mass in the left adnexa with irregular borders and blood flow. The Pulsatile index is <1 and the resistive index is <0.4. There is a large amount of pelvic fluid. Concerning for possible ovarian cancer.        08/27/2016 Tumor Marker    Patient's tumor was tested for the following markers: CA125. Results of the tumor marker test revealed 1577      09/09/2016 Pathology Results    Primary Tumor Site(s):Left fallopian tube Tumor Size:Greatest dimension (cm): 3.5 cm Histologic Type:Serous high-grade carcinoma Histologic Grade:G3: Poorly differentiated  Ovarian Surface Involvement:Present  Specimen(s):Right ovary  Right Ovary - Extent:Not involved  Specimen(s):Left ovary  Left Ovary - Extent:Involved  Specimen(s):Right fallopian tube  Right Fallopian Tube - Extent:Not involved  Specimen(s):Left fallopian tube  Left Fallopian Tube - Extent:Involved  Specimen(s):Omentum  Omentum - Extent:Not involved  Specimen(s):Uterus  Uterus - Extent:Not involved  Specimen(s):Cervix  Cervix - Extent:Not involved  Specimen(s):Peritoneum  Peritoneum - Extent:Not involved  Peritoneal Ascitic Fluid:Negative for malignancy (normal / benign)  Pleural Fluid:Not performed / unknown  LYMPH NODES  Pelvic Lymph Nodes:   Number of Pelvic Lymph Nodes Examined:Specify number: 17  Number of Pelvic Lymph Nodes Involved:None identified  Para-aortic Lymph Nodes:  Number of Para-aortic Lymph Nodes Examined:Specify number: 2  Number of Para-aortic Lymph Nodes Involved:None identified  STAGE (pTNM, AJCC 7th ed.)  Primary Tumor (pT):Fallopian tube  Fallopian Tube Primary Tumor (pT):pT2a: Extension and / or metastasis to the uterus and / or ovaries  Regional Lymph Nodes (pN):pN0: No regional lymph node metastasis  Distant Metastasis (pM):Not applicable - pM cannot be determined from the submitted specimen(s)  FIGO STAGE (FIGO 2014)  FIGO Stage:IIA: Extension and / or implants on the uterus and / or fallopian tubes and / or ovaries      09/09/2016 Pathology Results    Pelvic fluid, washing: No definitive malignant cells identified. Blood, histiocytes, and reactive mesothelial cells present. Mixed inflammation present. See comment.      09/09/2016 Surgery    Procedures: Bilateral - Robotic Laparoscopy, Surgical; With Bilateral Total Pelvic Lymphadenectomy Peri-Aortic Lymph Node Sample, Sgl/Mult Robotic Bilateral Salpingo-Oophorectomy W/Omentectomy, Total Abd Hysterectomy & Radical Dissection For Debulking;    Findings:  1. 9-10 cm left adnexal mass with rupture of tumor during manipulation 2. Dilated left fallopian tube removed in tact with IOFS: poorly differentiated carcinoma 3. Endometriosis implants on anterior and posterior cul-de-sac 4. Grossly normal omentum, diaphragm, small bowel mesenter 5. Grossly normal uterus and right fallopian tube/ovary 6. IOFS: anterior peritoneal biopsy (endometriosis) // left fallopian tube: poorly differentiated carcinoma 7. 1-2 cm tumor nodule in posterior cul-de-sac; completely resected 8. Non-enlarged lymph nodes 9. R0 resection at end of case 10. Ascites present      11/30/2016 Imaging    1. Nonspecific  trace fluid in the pelvic cul-de-sac. No discrete mass, adenopathy or other specific finding of local tumor recurrence in the pelvis. 2. No evidence  of metastatic disease in the chest or abdomen.      12/01/2016 Procedure    Successful placement of a right IJ approach Power Port with ultrasound and fluoroscopic guidance. 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  The [facility] gene panel offered by [employer] includes sequencing and deletion/duplication testing of the following 28 genes: APC, ATM, BARD1, BMPR1A, BRCA1, BRCA2, BRIP1, CHD1, CDK4, CDKN2A, CHEK2, EPCAM (large rearrangement only), MLH1, MSH2, MSH6, MUTYH, NBN, PALB2, PMS2, PTEN, RAD51C, RAD51D, SMAD4, STK11, and TP53. Sequencing was performed for select regions of POLE and POLD1, and large rearrangement analysis was performed for select regions of GREM1. The report date is December 11, 2016.  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  HRD can be indicated by the presence of a tumor BRCA1 or BRCA2 mutation and/or genomic instability. HRD testing found genomic instability.  The report date is December 21, 2016.        01/18/2017 Adverse Reaction    Dose of Taxol is reduced due to elevated liver enzymes       INTERVAL HISTORY: Please see below for problem oriented charting. She returns with her son and interpreter She felt weak, fatigue with poor appetite for the first few days after chemo She is feeling better She denies profound nausea or vomiting Denies constipation No recent pain Denies peripheral neuropathy from treatment  REVIEW OF SYSTEMS:    Constitutional: Denies fevers, chills or abnormal weight loss Eyes: Denies blurriness of vision Ears, nose, mouth, throat, and face: Denies mucositis or sore throat Respiratory: Denies cough, dyspnea or wheezes Cardiovascular: Denies palpitation, chest discomfort or lower extremity swelling Gastrointestinal:  Denies nausea, heartburn or change in bowel habits Skin: Denies abnormal skin rashes Lymphatics: Denies new lymphadenopathy or easy bruising Neurological:Denies numbness, tingling or new weaknesses Behavioral/Psych: Mood is stable, no new changes  All other systems were reviewed with the patient and are negative.  I have reviewed the past medical history, past surgical history, social history and family history with the patient and they are unchanged from previous note.  ALLERGIES:  is allergic to hydrocodone-acetaminophen.  MEDICATIONS:  Current Outpatient Prescriptions  Medication Sig Dispense Refill  . Cholecalciferol (VITAMIN D) 2000 units tablet Take one tablet twice daily.    Marland Kitchen dexamethasone (DECADRON) 4 MG tablet Take 5 tablets at 6 am on the day of treatment, every 3 weeks 30 tablet 1  . Ibuprofen (ADVIL PO) Take by mouth as needed.    . lidocaine-prilocaine (EMLA) cream Apply to affected area once 30 g 3  . ondansetron (ZOFRAN) 8 MG tablet Take 1 tablet (8 mg total) by mouth 2 (two) times daily as needed for refractory nausea / vomiting. Start on day 3 after chemo. 30 tablet 1  . ondansetron (ZOFRAN-ODT) 4 MG disintegrating tablet DIS ONE T PO Q 6 H PRN FOR UP TO 7 DAYS  0  . prochlorperazine (COMPAZINE) 10 MG tablet Take 1 tablet (10 mg total) by mouth every 6 (six) hours as needed (Nausea or vomiting). 60 tablet 1  . simethicone (MYLICON) 409 MG chewable tablet Chew  125 mg by mouth every 6 (six) hours as needed for flatulence.     No current facility-administered medications for this visit.     PHYSICAL EXAMINATION: ECOG PERFORMANCE STATUS: 1 - Symptomatic but  completely ambulatory  Vitals:   02/04/17 1221  BP: 131/79  Pulse: 76  Resp: 18  Temp: 98.5 F (36.9 C)   Filed Weights   02/04/17 1221  Weight: 128 lb 8 oz (58.3 kg)    GENERAL:alert, no distress and comfortable SKIN: skin color, texture, turgor are normal, no rashes or significant lesions EYES: normal, Conjunctiva are pink and non-injected, sclera clear OROPHARYNX:no exudate, no erythema and lips, buccal mucosa, and tongue normal  NECK: supple, thyroid normal size, non-tender, without nodularity LYMPH:  no palpable lymphadenopathy in the cervical, axillary or inguinal LUNGS: clear to auscultation and percussion with normal breathing effort HEART: regular rate & rhythm and no murmurs and no lower extremity edema ABDOMEN:abdomen soft, non-tender and normal bowel sounds Musculoskeletal:no cyanosis of digits and no clubbing  NEURO: alert & oriented x 3 with fluent speech, no focal motor/sensory deficits  LABORATORY DATA:  I have reviewed the data as listed    Component Value Date/Time   NA 141 02/04/2017 1213   K 4.7 02/04/2017 1213   CL 103 12/01/2016 1242   CO2 28 02/04/2017 1213   GLUCOSE 91 02/04/2017 1213   BUN 9.1 02/04/2017 1213   CREATININE 0.8 02/04/2017 1213   CALCIUM 9.9 02/04/2017 1213   PROT 7.8 02/04/2017 1213   ALBUMIN 3.8 02/04/2017 1213   AST 58 (H) 02/04/2017 1213   ALT 71 (H) 02/04/2017 1213   ALKPHOS 201 (H) 02/04/2017 1213   BILITOT <0.22 02/04/2017 1213   GFRNONAA >60 12/01/2016 1242   GFRAA >60 12/01/2016 1242    No results found for: SPEP, UPEP  Lab Results  Component Value Date   WBC 2.1 (L) 02/04/2017   NEUTROABS 0.5 (LL) 02/04/2017   HGB 10.9 (L) 02/04/2017   HCT 34.2 (L) 02/04/2017   MCV 79.9 02/04/2017   PLT 207 02/04/2017      Chemistry      Component Value Date/Time   NA 141 02/04/2017 1213   K 4.7 02/04/2017 1213   CL 103 12/01/2016 1242   CO2 28 02/04/2017 1213   BUN 9.1 02/04/2017 1213   CREATININE 0.8 02/04/2017  1213      Component Value Date/Time   CALCIUM 9.9 02/04/2017 1213   ALKPHOS 201 (H) 02/04/2017 1213   AST 58 (H) 02/04/2017 1213   ALT 71 (H) 02/04/2017 1213   BILITOT <0.22 02/04/2017 1213     ASSESSMENT & PLAN:  Cancer of left fallopian tube ([facility]) Overall, she has problems with hydration and oral intake within the first few days after chemotherapy We discussed various strategies including adding dexamethasone daily for a few days afterwards We also discussed options for hydration including IV fluids if needed We will delay her next cycle of treatment a little bit due to plans for some events I will see her back again before her next cycle of treatment  Pancytopenia, acquired ([place]) She has acquired pancytopenia We discussed neutropenic precaution She is not due for treatment next week and we will observe only at this point  Elevated liver enzymes She has elevated liver enzymes likely related to side effects of chemotherapy I have discussed with the pharmacist who felt that based on dosing guidelines dose adjustment is not necessary. However, having said that, so for safety reason, I plan  to reduce a dose of chemotherapy a little bit cycle 3 and beyond I will also delay cycle 4 x 1 week to allow adequate time for recovery of her liver enzymes before we proceed with cycle 4 of therapy   No orders of the defined types were placed in this encounter.  All questions were answered. The patient knows to call the clinic with any problems, questions or concerns. No barriers to learning was detected. I spent 15 minutes counseling the patient face to face. The total time spent in the appointment was 20 minutes and more than 50% was on counseling and review of test results     Heath Lark, MD 02/05/2017 8:44 AM

## 2017-02-05 NOTE — Assessment & Plan Note (Signed)
Overall, she has problems with hydration and oral intake within the first few days after chemotherapy We discussed various strategies including adding dexamethasone daily for a few days afterwards We also discussed options for hydration including IV fluids if needed We will delay her next cycle of treatment a little bit due to plans for some events I will see her back again before her next cycle of treatment

## 2017-02-05 NOTE — Assessment & Plan Note (Signed)
She has elevated liver enzymes likely related to side effects of chemotherapy I have discussed with the pharmacist who felt that based on dosing guidelines dose adjustment is not necessary. However, having said that, so for safety reason, I plan to reduce a dose of chemotherapy a little bit cycle 3 and beyond I will also delay cycle 4 x 1 week to allow adequate time for recovery of her liver enzymes before we proceed with cycle 4 of therapy

## 2017-02-05 NOTE — Assessment & Plan Note (Signed)
She has acquired pancytopenia We discussed neutropenic precaution She is not due for treatment next week and we will observe only at this point

## 2017-02-09 ENCOUNTER — Ambulatory Visit: Payer: BLUE CROSS/BLUE SHIELD

## 2017-02-16 ENCOUNTER — Other Ambulatory Visit (HOSPITAL_BASED_OUTPATIENT_CLINIC_OR_DEPARTMENT_OTHER): Payer: BLUE CROSS/BLUE SHIELD

## 2017-02-16 ENCOUNTER — Other Ambulatory Visit: Payer: Self-pay | Admitting: Hematology and Oncology

## 2017-02-16 ENCOUNTER — Ambulatory Visit (HOSPITAL_BASED_OUTPATIENT_CLINIC_OR_DEPARTMENT_OTHER): Payer: BLUE CROSS/BLUE SHIELD

## 2017-02-16 ENCOUNTER — Telehealth: Payer: Self-pay | Admitting: Hematology and Oncology

## 2017-02-16 ENCOUNTER — Ambulatory Visit: Payer: BLUE CROSS/BLUE SHIELD

## 2017-02-16 ENCOUNTER — Ambulatory Visit (HOSPITAL_BASED_OUTPATIENT_CLINIC_OR_DEPARTMENT_OTHER): Payer: BLUE CROSS/BLUE SHIELD | Admitting: Hematology and Oncology

## 2017-02-16 VITALS — BP 149/73 | HR 104 | Temp 98.9°F | Resp 20 | Ht 62.25 in | Wt 127.7 lb

## 2017-02-16 DIAGNOSIS — R748 Abnormal levels of other serum enzymes: Secondary | ICD-10-CM | POA: Diagnosis not present

## 2017-02-16 DIAGNOSIS — M25552 Pain in left hip: Secondary | ICD-10-CM

## 2017-02-16 DIAGNOSIS — Z5111 Encounter for antineoplastic chemotherapy: Secondary | ICD-10-CM | POA: Diagnosis not present

## 2017-02-16 DIAGNOSIS — G8929 Other chronic pain: Secondary | ICD-10-CM

## 2017-02-16 DIAGNOSIS — C5702 Malignant neoplasm of left fallopian tube: Secondary | ICD-10-CM

## 2017-02-16 LAB — CBC WITH DIFFERENTIAL/PLATELET
BASO%: 0.1 % (ref 0.0–2.0)
Basophils Absolute: 0 10*3/uL (ref 0.0–0.1)
EOS%: 0 % (ref 0.0–7.0)
Eosinophils Absolute: 0 10*3/uL (ref 0.0–0.5)
HCT: 36 % (ref 34.8–46.6)
HGB: 11.5 g/dL — ABNORMAL LOW (ref 11.6–15.9)
LYMPH%: 7.5 % — ABNORMAL LOW (ref 14.0–49.7)
MCH: 26.6 pg (ref 25.1–34.0)
MCHC: 31.9 g/dL (ref 31.5–36.0)
MCV: 83.3 fL (ref 79.5–101.0)
MONO#: 0 10*3/uL — ABNORMAL LOW (ref 0.1–0.9)
MONO%: 0.3 % (ref 0.0–14.0)
NEUT%: 92.1 % — ABNORMAL HIGH (ref 38.4–76.8)
NEUTROS ABS: 6.6 10*3/uL — AB (ref 1.5–6.5)
NRBC: 0 % (ref 0–0)
Platelets: 219 10*3/uL (ref 145–400)
RBC: 4.32 10*6/uL (ref 3.70–5.45)
RDW: 24.9 % — AB (ref 11.2–14.5)
WBC: 7.2 10*3/uL (ref 3.9–10.3)
lymph#: 0.5 10*3/uL — ABNORMAL LOW (ref 0.9–3.3)

## 2017-02-16 LAB — COMPREHENSIVE METABOLIC PANEL
ALT: 73 U/L — AB (ref 0–55)
AST: 62 U/L — AB (ref 5–34)
Albumin: 4.1 g/dL (ref 3.5–5.0)
Alkaline Phosphatase: 195 U/L — ABNORMAL HIGH (ref 40–150)
Anion Gap: 10 mEq/L (ref 3–11)
BILIRUBIN TOTAL: 0.35 mg/dL (ref 0.20–1.20)
BUN: 11 mg/dL (ref 7.0–26.0)
CO2: 26 meq/L (ref 22–29)
CREATININE: 0.7 mg/dL (ref 0.6–1.1)
Calcium: 10.2 mg/dL (ref 8.4–10.4)
Chloride: 105 mEq/L (ref 98–109)
EGFR: >90 mL/min/{1.73_m2} (ref >90)
GLUCOSE: 117 mg/dL (ref 70–140)
Potassium: 4 mEq/L (ref 3.5–5.1)
SODIUM: 141 meq/L (ref 136–145)
TOTAL PROTEIN: 8 g/dL (ref 6.4–8.3)

## 2017-02-16 MED ORDER — SODIUM CHLORIDE 0.9 % IV SOLN
Freq: Once | INTRAVENOUS | Status: AC
  Administered 2017-02-16: 11:00:00 via INTRAVENOUS

## 2017-02-16 MED ORDER — FAMOTIDINE IN NACL 20-0.9 MG/50ML-% IV SOLN
20.0000 mg | Freq: Once | INTRAVENOUS | Status: AC
  Administered 2017-02-16: 20 mg via INTRAVENOUS

## 2017-02-16 MED ORDER — FAMOTIDINE IN NACL 20-0.9 MG/50ML-% IV SOLN
INTRAVENOUS | Status: AC
  Filled 2017-02-16: qty 50

## 2017-02-16 MED ORDER — PALONOSETRON HCL INJECTION 0.25 MG/5ML
INTRAVENOUS | Status: AC
  Filled 2017-02-16: qty 5

## 2017-02-16 MED ORDER — SODIUM CHLORIDE 0.9 % IV SOLN
20.0000 mg | Freq: Once | INTRAVENOUS | Status: AC
  Administered 2017-02-16: 20 mg via INTRAVENOUS
  Filled 2017-02-16: qty 2

## 2017-02-16 MED ORDER — HEPARIN SOD (PORK) LOCK FLUSH 100 UNIT/ML IV SOLN
500.0000 [IU] | Freq: Once | INTRAVENOUS | Status: AC | PRN
  Administered 2017-02-16: 500 [IU]
  Filled 2017-02-16: qty 5

## 2017-02-16 MED ORDER — SODIUM CHLORIDE 0.9% FLUSH
10.0000 mL | INTRAVENOUS | Status: DC | PRN
  Administered 2017-02-16: 10 mL
  Filled 2017-02-16: qty 10

## 2017-02-16 MED ORDER — DIPHENHYDRAMINE HCL 50 MG/ML IJ SOLN
50.0000 mg | Freq: Once | INTRAMUSCULAR | Status: AC
  Administered 2017-02-16: 50 mg via INTRAVENOUS

## 2017-02-16 MED ORDER — SODIUM CHLORIDE 0.9 % IV SOLN
600.0000 mg | Freq: Once | INTRAVENOUS | Status: AC
  Administered 2017-02-16: 600 mg via INTRAVENOUS
  Filled 2017-02-16: qty 60

## 2017-02-16 MED ORDER — SODIUM CHLORIDE 0.9% FLUSH
10.0000 mL | Freq: Once | INTRAVENOUS | Status: AC
  Administered 2017-02-16: 10 mL
  Filled 2017-02-16: qty 10

## 2017-02-16 MED ORDER — DEXTROSE 5 % IV SOLN
116.6667 mg/m2 | Freq: Once | INTRAVENOUS | Status: AC
  Administered 2017-02-16: 186 mg via INTRAVENOUS
  Filled 2017-02-16: qty 31

## 2017-02-16 MED ORDER — PALONOSETRON HCL INJECTION 0.25 MG/5ML
0.2500 mg | Freq: Once | INTRAVENOUS | Status: AC
  Administered 2017-02-16: 0.25 mg via INTRAVENOUS

## 2017-02-16 MED ORDER — DIPHENHYDRAMINE HCL 50 MG/ML IJ SOLN
INTRAMUSCULAR | Status: AC
  Filled 2017-02-16: qty 1

## 2017-02-16 NOTE — Telephone Encounter (Signed)
Scheduled apt per 8/7 los - Gave patient  AVS and calender per los. 

## 2017-02-16 NOTE — Patient Instructions (Signed)

## 2017-02-16 NOTE — Patient Instructions (Signed)
[facility] Discharge Instructions for Patients Receiving Chemotherapy  Today you received the following chemotherapy agents Taxol and Carboplatin. To help prevent nausea and vomiting after your treatment, we encourage you to take your nausea medication as directed.  If you develop nausea and vomiting that is not controlled by your nausea medication, call the clinic.   BELOW ARE SYMPTOMS THAT SHOULD BE REPORTED IMMEDIATELY:  *FEVER GREATER THAN 100.5 F  *CHILLS WITH OR WITHOUT FEVER  NAUSEA AND VOMITING THAT IS NOT CONTROLLED WITH YOUR NAUSEA MEDICATION  *UNUSUAL SHORTNESS OF BREATH  *UNUSUAL BRUISING OR BLEEDING  TENDERNESS IN MOUTH AND THROAT WITH OR WITHOUT PRESENCE OF ULCERS  *URINARY PROBLEMS  *BOWEL PROBLEMS  UNUSUAL RASH Items with * indicate a potential emergency and should be followed up as soon as possible.  Feel free to call the clinic you have any questions or concerns. The clinic phone number is [phone].  Please show the CHEMO ALERT CARD at check-in to the Emergency Department and triage nurse.    

## 2017-02-18 ENCOUNTER — Encounter: Payer: Self-pay | Admitting: Hematology and Oncology

## 2017-02-18 NOTE — Assessment & Plan Note (Signed)
She has elevated liver enzymes likely related to side effects of chemotherapy I will further reduce the dose of Taxol

## 2017-02-18 NOTE — Assessment & Plan Note (Signed)
Overall, she has problems with hydration and oral intake within the first few days after chemotherapy We discussed various strategies including adding dexamethasone daily for a few days afterwards We also discussed options for hydration including IV fluids if needed We will delay her next cycle of treatment a little bit  Due to persistent blood work abnormalities including abnormal liver enzymes, I will further reduce the dose of Taxol

## 2017-02-18 NOTE — Progress Notes (Signed)
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 Talbert Nan after she started to have vaginal discharge. Pelvic US showed: 7.4 x 6.4 x 6.6 cm solid mass in the left adnexa with irregular borders and blood flow. The Pulsatile index is <1 and the resistive index is <0.4. There is a large amount of pelvic fluid. Concerning for possible ovarian cancer.        08/27/2016 Tumor Marker    Patient's tumor was tested for the following markers: CA125. Results of the tumor marker test revealed 1577      09/09/2016 Pathology Results    Primary Tumor Site(s):Left fallopian tube Tumor Size:Greatest dimension (cm): 3.5 cm Histologic Type:Serous high-grade carcinoma Histologic Grade:G3: Poorly differentiated  Ovarian Surface Involvement:Present  Specimen(s):Right ovary  Right Ovary - Extent:Not involved  Specimen(s):Left ovary  Left Ovary - Extent:Involved  Specimen(s):Right fallopian tube  Right Fallopian Tube - Extent:Not involved  Specimen(s):Left fallopian tube  Left Fallopian Tube - Extent:Involved  Specimen(s):Omentum  Omentum - Extent:Not involved  Specimen(s):Uterus  Uterus - Extent:Not involved  Specimen(s):Cervix  Cervix - Extent:Not involved  Specimen(s):Peritoneum  Peritoneum - Extent:Not involved  Peritoneal Ascitic Fluid:Negative for malignancy (normal / benign)  Pleural Fluid:Not performed / unknown  LYMPH NODES  Pelvic Lymph Nodes:   Number of Pelvic Lymph Nodes Examined:Specify number: 17  Number of Pelvic Lymph Nodes Involved:None identified  Para-aortic Lymph Nodes:  Number of Para-aortic Lymph Nodes Examined:Specify number: 2  Number of Para-aortic Lymph Nodes Involved:None identified  STAGE (pTNM, AJCC 7th ed.)  Primary Tumor (pT):Fallopian tube  Fallopian Tube Primary Tumor (pT):pT2a: Extension and / or metastasis to the uterus and / or ovaries  Regional Lymph Nodes (pN):pN0: No regional lymph node metastasis  Distant Metastasis (pM):Not applicable - pM cannot be determined from the submitted specimen(s)  FIGO STAGE (FIGO 2014)  FIGO Stage:IIA: Extension and / or implants on the uterus and / or fallopian tubes and / or ovaries      09/09/2016 Pathology Results    Pelvic fluid, washing: No definitive malignant cells identified. Blood, histiocytes, and reactive mesothelial cells present. Mixed inflammation present. See comment.      09/09/2016 Surgery    Procedures: Bilateral - Robotic Laparoscopy, Surgical; With Bilateral Total Pelvic Lymphadenectomy Peri-Aortic Lymph Node Sample, Sgl/Mult Robotic Bilateral Salpingo-Oophorectomy W/Omentectomy, Total Abd Hysterectomy & Radical Dissection For Debulking;    Findings:  1. 9-10 cm left adnexal mass with rupture of tumor during manipulation 2. Dilated left fallopian tube removed in tact with IOFS: poorly differentiated carcinoma 3. Endometriosis implants on anterior and posterior cul-de-sac 4. Grossly normal omentum, diaphragm, small bowel mesenter 5. Grossly normal uterus and right fallopian tube/ovary 6. IOFS: anterior peritoneal biopsy (endometriosis) // left fallopian tube: poorly differentiated carcinoma 7. 1-2 cm tumor nodule in posterior cul-de-sac; completely resected 8. Non-enlarged lymph nodes 9. R0 resection at end of case 10. Ascites present      11/30/2016 Imaging    1. Nonspecific  trace fluid in the pelvic cul-de-sac. No discrete mass, adenopathy or other specific finding of local tumor recurrence in the pelvis. 2. No evidence  of metastatic disease in the chest or abdomen.      12/01/2016 Procedure    Successful placement of a right IJ approach Power Port with ultrasound and fluoroscopic guidance. The catheter is ready for use      12/02/2016 Tumor Marker    Patient's tumor was tested for the following markers: CA125 Results of the tumor marker test revealed 7.8      12/04/2016 -  Chemotherapy    She received carboplatin and Taxol      12/21/2016 Genetic Testing    CDKN2A (p16INK4a) c.413G>A (p.Arg138Lys) and MLH1 c.290A>G (p.Tyr97Cys) VUS found on genetic testing on the [facility] panel.  The [facility] gene panel offered by [employer] includes sequencing and deletion/duplication testing of the following 28 genes: APC, ATM, BARD1, BMPR1A, BRCA1, BRCA2, BRIP1, CHD1, CDK4, CDKN2A, CHEK2, EPCAM (large rearrangement only), MLH1, MSH2, MSH6, MUTYH, NBN, PALB2, PMS2, PTEN, RAD51C, RAD51D, SMAD4, STK11, and TP53. Sequencing was performed for select regions of POLE and POLD1, and large rearrangement analysis was performed for select regions of GREM1. The report date is December 11, 2016.  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  HRD can be indicated by the presence of a tumor BRCA1 or BRCA2 mutation and/or genomic instability. HRD testing found genomic instability.  The report date is December 21, 2016.        01/18/2017 Adverse Reaction    Dose of Taxol is reduced due to elevated liver enzymes       INTERVAL HISTORY: Please see below for problem oriented charting. She returns prior to cycle 4 of treatment She complains of persistent leg pain Denies peripheral neuropathy She denies nausea or vomiting She has lost some weight due to altered taste sensation She complain of mild fatigue and not able to work  REVIEW OF SYSTEMS:   Constitutional: Denies  fevers, chills  Eyes: Denies blurriness of vision Ears, nose, mouth, throat, and face: Denies mucositis or sore throat Respiratory: Denies cough, dyspnea or wheezes Cardiovascular: Denies palpitation, chest discomfort or lower extremity swelling Gastrointestinal:  Denies nausea, heartburn or change in bowel habits Skin: Denies abnormal skin rashes Lymphatics: Denies new lymphadenopathy or easy bruising Neurological:Denies numbness, tingling or new weaknesses Behavioral/Psych: Mood is stable, no new changes  All other systems were reviewed with the patient and are negative.  I have reviewed the past medical history, past surgical history, social history and family history with the patient and they are unchanged from previous note.  ALLERGIES:  is allergic to hydrocodone-acetaminophen.  MEDICATIONS:  Current Outpatient Prescriptions  Medication Sig Dispense Refill  . Cholecalciferol (VITAMIN D) 2000 units tablet Take one tablet twice daily.    Marland Kitchen dexamethasone (DECADRON) 4 MG tablet Take 5 tablets at 6 am on the day of treatment, every 3 weeks 30 tablet 1  . Ibuprofen (ADVIL PO) Take by mouth as needed.    . lidocaine-prilocaine (EMLA) cream Apply to affected area once 30 g 3  . ondansetron (ZOFRAN) 8 MG tablet Take 1 tablet (8 mg total) by mouth 2 (two) times daily as needed for refractory nausea / vomiting. Start on day 3 after chemo. 30 tablet 1  . ondansetron (ZOFRAN-ODT) 4 MG disintegrating tablet DIS ONE T PO Q 6 H PRN FOR UP TO 7 DAYS  0  . prochlorperazine (COMPAZINE) 10 MG tablet Take 1 tablet (10 mg total) by mouth every 6 (six) hours as needed (Nausea or vomiting). 60 tablet 1  . simethicone (MYLICON) 638 MG chewable tablet Chew 125 mg  by mouth every 6 (six) hours as needed for flatulence.     No current facility-administered medications for this visit.     PHYSICAL EXAMINATION: ECOG PERFORMANCE STATUS: 1 - Symptomatic but completely ambulatory  Vitals:   02/16/17 1009   BP: (!) 149/73  Pulse: (!) 104  Resp: 20  Temp: 98.9 F (37.2 C)  SpO2: 100%   Filed Weights   02/16/17 1009  Weight: 127 lb 11.2 oz (57.9 kg)    GENERAL:alert, no distress and comfortable SKIN: skin color, texture, turgor are normal, no rashes or significant lesions EYES: normal, Conjunctiva are pink and non-injected, sclera clear OROPHARYNX:no exudate, no erythema and lips, buccal mucosa, and tongue normal  NECK: supple, thyroid normal size, non-tender, without nodularity LYMPH:  no palpable lymphadenopathy in the cervical, axillary or inguinal LUNGS: clear to auscultation and percussion with normal breathing effort HEART: regular rate & rhythm and no murmurs and no lower extremity edema ABDOMEN:abdomen soft, non-tender and normal bowel sounds Musculoskeletal:no cyanosis of digits and no clubbing  NEURO: alert & oriented x 3 with fluent speech, no focal motor/sensory deficits  LABORATORY DATA:  I have reviewed the data as listed    Component Value Date/Time   NA 141 02/16/2017 0948   K 4.0 02/16/2017 0948   CL 103 12/01/2016 1242   CO2 26 02/16/2017 0948   GLUCOSE 117 02/16/2017 0948   BUN 11.0 02/16/2017 0948   CREATININE 0.7 02/16/2017 0948   CALCIUM 10.2 02/16/2017 0948   PROT 8.0 02/16/2017 0948   ALBUMIN 4.1 02/16/2017 0948   AST 62 (H) 02/16/2017 0948   ALT 73 (H) 02/16/2017 0948   ALKPHOS 195 (H) 02/16/2017 0948   BILITOT 0.35 02/16/2017 0948   GFRNONAA >60 12/01/2016 1242   GFRAA >60 12/01/2016 1242    No results found for: SPEP, UPEP  Lab Results  Component Value Date   WBC 7.2 02/16/2017   NEUTROABS 6.6 (H) 02/16/2017   HGB 11.5 (L) 02/16/2017   HCT 36.0 02/16/2017   MCV 83.3 02/16/2017   PLT 219 02/16/2017      Chemistry      Component Value Date/Time   NA 141 02/16/2017 0948   K 4.0 02/16/2017 0948   CL 103 12/01/2016 1242   CO2 26 02/16/2017 0948   BUN 11.0 02/16/2017 0948   CREATININE 0.7 02/16/2017 0948      Component Value  Date/Time   CALCIUM 10.2 02/16/2017 0948   ALKPHOS 195 (H) 02/16/2017 0948   AST 62 (H) 02/16/2017 0948   ALT 73 (H) 02/16/2017 0948   BILITOT 0.35 02/16/2017 0948    ASSESSMENT & PLAN:  Cancer of left fallopian tube ([facility]) Overall, she has problems with hydration and oral intake within the first few days after chemotherapy We discussed various strategies including adding dexamethasone daily for a few days afterwards We also discussed options for hydration including IV fluids if needed We will delay her next cycle of treatment a little bit  Due to persistent blood work abnormalities including abnormal liver enzymes, I will further reduce the dose of Taxol  Elevated liver enzymes She has elevated liver enzymes likely related to side effects of chemotherapy I will further reduce the dose of Taxol  Chronic hip pain, left She has significant leg pain I recommend referral to physical therapy and rehab   Orders Placed This Encounter  Procedures  . Ambulatory Referral to Physical Therapy    Referral Priority:   Routine    Referral Type:  Physical Medicine    Referral Reason:   Specialty Services Required    Requested Specialty:   Physical Therapy    Number of Visits Requested:   1   All questions were answered. The patient knows to call the clinic with any problems, questions or concerns. No barriers to learning was detected. I spent 20 minutes counseling the patient face to face. The total time spent in the appointment was 30 minutes and more than 50% was on counseling and review of test results     Heath Lark, MD 02/18/2017 1:56 PM

## 2017-02-18 NOTE — Assessment & Plan Note (Signed)
She has significant leg pain I recommend referral to physical therapy and rehab

## 2017-02-25 ENCOUNTER — Ambulatory Visit: Payer: BLUE CROSS/BLUE SHIELD | Admitting: Physical Therapy

## 2017-03-02 ENCOUNTER — Ambulatory Visit: Payer: BLUE CROSS/BLUE SHIELD | Admitting: Physical Therapy

## 2017-03-12 ENCOUNTER — Ambulatory Visit: Payer: BLUE CROSS/BLUE SHIELD

## 2017-03-12 ENCOUNTER — Other Ambulatory Visit (HOSPITAL_BASED_OUTPATIENT_CLINIC_OR_DEPARTMENT_OTHER): Payer: Self-pay

## 2017-03-12 ENCOUNTER — Ambulatory Visit (HOSPITAL_BASED_OUTPATIENT_CLINIC_OR_DEPARTMENT_OTHER): Payer: Self-pay | Admitting: Hematology and Oncology

## 2017-03-12 ENCOUNTER — Ambulatory Visit (HOSPITAL_BASED_OUTPATIENT_CLINIC_OR_DEPARTMENT_OTHER): Payer: Self-pay

## 2017-03-12 ENCOUNTER — Telehealth: Payer: Self-pay | Admitting: Hematology and Oncology

## 2017-03-12 DIAGNOSIS — C5702 Malignant neoplasm of left fallopian tube: Secondary | ICD-10-CM

## 2017-03-12 DIAGNOSIS — G8929 Other chronic pain: Secondary | ICD-10-CM

## 2017-03-12 DIAGNOSIS — M25552 Pain in left hip: Secondary | ICD-10-CM

## 2017-03-12 DIAGNOSIS — R748 Abnormal levels of other serum enzymes: Secondary | ICD-10-CM

## 2017-03-12 DIAGNOSIS — Z5111 Encounter for antineoplastic chemotherapy: Secondary | ICD-10-CM

## 2017-03-12 LAB — CBC WITH DIFFERENTIAL/PLATELET
BASO%: 0.2 % (ref 0.0–2.0)
Basophils Absolute: 0 10*3/uL (ref 0.0–0.1)
EOS ABS: 0 10*3/uL (ref 0.0–0.5)
EOS%: 0 % (ref 0.0–7.0)
HCT: 36.2 % (ref 34.8–46.6)
HEMOGLOBIN: 11.9 g/dL (ref 11.6–15.9)
LYMPH%: 4.4 % — ABNORMAL LOW (ref 14.0–49.7)
MCH: 28.8 pg (ref 25.1–34.0)
MCHC: 33 g/dL (ref 31.5–36.0)
MCV: 87.4 fL (ref 79.5–101.0)
MONO#: 0.1 10*3/uL (ref 0.1–0.9)
MONO%: 0.8 % (ref 0.0–14.0)
NEUT%: 94.6 % — ABNORMAL HIGH (ref 38.4–76.8)
NEUTROS ABS: 10.2 10*3/uL — AB (ref 1.5–6.5)
Platelets: 158 10*3/uL (ref 145–400)
RBC: 4.14 10*6/uL (ref 3.70–5.45)
RDW: 23.2 % — AB (ref 11.2–14.5)
WBC: 10.8 10*3/uL — AB (ref 3.9–10.3)
lymph#: 0.5 10*3/uL — ABNORMAL LOW (ref 0.9–3.3)

## 2017-03-12 LAB — COMPREHENSIVE METABOLIC PANEL
ALBUMIN: 3.9 g/dL (ref 3.5–5.0)
ALK PHOS: 164 U/L — AB (ref 40–150)
ALT: 37 U/L (ref 0–55)
AST: 35 U/L — AB (ref 5–34)
Anion Gap: 9 mEq/L (ref 3–11)
BILIRUBIN TOTAL: 0.33 mg/dL (ref 0.20–1.20)
BUN: 13.7 mg/dL (ref 7.0–26.0)
CO2: 26 mEq/L (ref 22–29)
CREATININE: 0.8 mg/dL (ref 0.6–1.1)
Calcium: 10.1 mg/dL (ref 8.4–10.4)
Chloride: 106 mEq/L (ref 98–109)
GLUCOSE: 109 mg/dL (ref 70–140)
Potassium: 4.1 mEq/L (ref 3.5–5.1)
SODIUM: 141 meq/L (ref 136–145)
TOTAL PROTEIN: 8 g/dL (ref 6.4–8.3)

## 2017-03-12 MED ORDER — FAMOTIDINE IN NACL 20-0.9 MG/50ML-% IV SOLN
20.0000 mg | Freq: Once | INTRAVENOUS | Status: AC
  Administered 2017-03-12: 20 mg via INTRAVENOUS

## 2017-03-12 MED ORDER — SODIUM CHLORIDE 0.9 % IV SOLN
600.0000 mg | Freq: Once | INTRAVENOUS | Status: AC
  Administered 2017-03-12: 600 mg via INTRAVENOUS
  Filled 2017-03-12: qty 60

## 2017-03-12 MED ORDER — DIPHENHYDRAMINE HCL 50 MG/ML IJ SOLN
50.0000 mg | Freq: Once | INTRAMUSCULAR | Status: AC
  Administered 2017-03-12: 50 mg via INTRAVENOUS

## 2017-03-12 MED ORDER — DIPHENHYDRAMINE HCL 50 MG/ML IJ SOLN
INTRAMUSCULAR | Status: AC
  Filled 2017-03-12: qty 1

## 2017-03-12 MED ORDER — SODIUM CHLORIDE 0.9 % IV SOLN
20.0000 mg | Freq: Once | INTRAVENOUS | Status: AC
  Administered 2017-03-12: 20 mg via INTRAVENOUS
  Filled 2017-03-12: qty 2

## 2017-03-12 MED ORDER — PALONOSETRON HCL INJECTION 0.25 MG/5ML
0.2500 mg | Freq: Once | INTRAVENOUS | Status: AC
  Administered 2017-03-12: 0.25 mg via INTRAVENOUS

## 2017-03-12 MED ORDER — SODIUM CHLORIDE 0.9% FLUSH
10.0000 mL | INTRAVENOUS | Status: DC | PRN
  Administered 2017-03-12: 10 mL
  Filled 2017-03-12: qty 10

## 2017-03-12 MED ORDER — FAMOTIDINE IN NACL 20-0.9 MG/50ML-% IV SOLN
INTRAVENOUS | Status: AC
  Filled 2017-03-12: qty 50

## 2017-03-12 MED ORDER — PALONOSETRON HCL INJECTION 0.25 MG/5ML
INTRAVENOUS | Status: AC
  Filled 2017-03-12: qty 5

## 2017-03-12 MED ORDER — SODIUM CHLORIDE 0.9 % IV SOLN
Freq: Once | INTRAVENOUS | Status: AC
  Administered 2017-03-12: 10:00:00 via INTRAVENOUS

## 2017-03-12 MED ORDER — PACLITAXEL CHEMO INJECTION 300 MG/50ML
116.6667 mg/m2 | Freq: Once | INTRAVENOUS | Status: AC
  Administered 2017-03-12: 186 mg via INTRAVENOUS
  Filled 2017-03-12: qty 31

## 2017-03-12 MED ORDER — HEPARIN SOD (PORK) LOCK FLUSH 100 UNIT/ML IV SOLN
500.0000 [IU] | Freq: Once | INTRAVENOUS | Status: AC | PRN
  Administered 2017-03-12: 500 [IU]
  Filled 2017-03-12: qty 5

## 2017-03-12 NOTE — Progress Notes (Signed)
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 Talbert Nan after she started to have vaginal discharge. Pelvic US showed: 7.4 x 6.4 x 6.6 cm solid mass in the left adnexa with irregular borders and blood flow. The Pulsatile index is <1 and the resistive index is <0.4. There is a large amount of pelvic fluid. Concerning for possible ovarian cancer.        08/27/2016 Tumor Marker    Patient's tumor was tested for the following markers: CA125. Results of the tumor marker test revealed 1577      09/09/2016 Pathology Results    Primary Tumor Site(s):Left fallopian tube Tumor Size:Greatest dimension (cm): 3.5 cm Histologic Type:Serous high-grade carcinoma Histologic Grade:G3: Poorly differentiated  Ovarian Surface Involvement:Present  Specimen(s):Right ovary  Right Ovary - Extent:Not involved  Specimen(s):Left ovary  Left Ovary - Extent:Involved  Specimen(s):Right fallopian tube  Right Fallopian Tube - Extent:Not involved  Specimen(s):Left fallopian tube  Left Fallopian Tube - Extent:Involved  Specimen(s):Omentum  Omentum - Extent:Not involved  Specimen(s):Uterus  Uterus - Extent:Not involved  Specimen(s):Cervix  Cervix - Extent:Not involved  Specimen(s):Peritoneum  Peritoneum - Extent:Not involved  Peritoneal Ascitic Fluid:Negative for malignancy (normal / benign)  Pleural Fluid:Not performed / unknown  LYMPH NODES  Pelvic Lymph Nodes:   Number of Pelvic Lymph Nodes Examined:Specify number: 17  Number of Pelvic Lymph Nodes Involved:None identified  Para-aortic Lymph Nodes:  Number of Para-aortic Lymph Nodes Examined:Specify number: 2  Number of Para-aortic Lymph Nodes Involved:None identified  STAGE (pTNM, AJCC 7th ed.)  Primary Tumor (pT):Fallopian tube  Fallopian Tube Primary Tumor (pT):pT2a: Extension and / or metastasis to the uterus and / or ovaries  Regional Lymph Nodes (pN):pN0: No regional lymph node metastasis  Distant Metastasis (pM):Not applicable - pM cannot be determined from the submitted specimen(s)  FIGO STAGE (FIGO 2014)  FIGO Stage:IIA: Extension and / or implants on the uterus and / or fallopian tubes and / or ovaries      09/09/2016 Pathology Results    Pelvic fluid, washing: No definitive malignant cells identified. Blood, histiocytes, and reactive mesothelial cells present. Mixed inflammation present. See comment.      09/09/2016 Surgery    Procedures: Bilateral - Robotic Laparoscopy, Surgical; With Bilateral Total Pelvic Lymphadenectomy Peri-Aortic Lymph Node Sample, Sgl/Mult Robotic Bilateral Salpingo-Oophorectomy W/Omentectomy, Total Abd Hysterectomy & Radical Dissection For Debulking;    Findings:  1. 9-10 cm left adnexal mass with rupture of tumor during manipulation 2. Dilated left fallopian tube removed in tact with IOFS: poorly differentiated carcinoma 3. Endometriosis implants on anterior and posterior cul-de-sac 4. Grossly normal omentum, diaphragm, small bowel mesenter 5. Grossly normal uterus and right fallopian tube/ovary 6. IOFS: anterior peritoneal biopsy (endometriosis) // left fallopian tube: poorly differentiated carcinoma 7. 1-2 cm tumor nodule in posterior cul-de-sac; completely resected 8. Non-enlarged lymph nodes 9. R0 resection at end of case 10. Ascites present      11/30/2016 Imaging    1. Nonspecific  trace fluid in the pelvic cul-de-sac. No discrete mass, adenopathy or other specific finding of local tumor recurrence in the pelvis. 2. No evidence  of metastatic disease in the chest or abdomen.      12/01/2016 Procedure    Successful placement of a right IJ approach Power Port with ultrasound and fluoroscopic guidance. The catheter is ready for use      12/02/2016 Tumor Marker    Patient's tumor was tested for the following markers: CA125 Results of the tumor marker test revealed 7.8      12/04/2016 -  Chemotherapy    She received carboplatin and Taxol      12/21/2016 Genetic Testing    CDKN2A (p16INK4a) c.413G>A (p.Arg138Lys) and MLH1 c.290A>G (p.Tyr97Cys) VUS found on genetic testing on the [facility] panel.  The [facility] gene panel offered by [employer] includes sequencing and deletion/duplication testing of the following 28 genes: APC, ATM, BARD1, BMPR1A, BRCA1, BRCA2, BRIP1, CHD1, CDK4, CDKN2A, CHEK2, EPCAM (large rearrangement only), MLH1, MSH2, MSH6, MUTYH, NBN, PALB2, PMS2, PTEN, RAD51C, RAD51D, SMAD4, STK11, and TP53. Sequencing was performed for select regions of POLE and POLD1, and large rearrangement analysis was performed for select regions of GREM1. The report date is December 11, 2016.  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  HRD can be indicated by the presence of a tumor BRCA1 or BRCA2 mutation and/or genomic instability. HRD testing found genomic instability.  The report date is December 21, 2016.        01/18/2017 Adverse Reaction    Dose of Taxol is reduced due to elevated liver enzymes       INTERVAL HISTORY: Please see below for problem oriented charting. She returns prior to cycle 5 of treatment She denies nausea, vomiting or peripheral neuropathy with last treatment She complained altered taste sensation and fatigue but denies recent weight loss She has persistent hip pain, stable.  REVIEW OF SYSTEMS:   Constitutional: Denies fevers, chills or  abnormal weight loss Eyes: Denies blurriness of vision Ears, nose, mouth, throat, and face: Denies mucositis or sore throat Respiratory: Denies cough, dyspnea or wheezes Cardiovascular: Denies palpitation, chest discomfort or lower extremity swelling Gastrointestinal:  Denies nausea, heartburn or change in bowel habits Skin: Denies abnormal skin rashes Lymphatics: Denies new lymphadenopathy or easy bruising Neurological:Denies numbness, tingling or new weaknesses Behavioral/Psych: Mood is stable, no new changes  All other systems were reviewed with the patient and are negative.  I have reviewed the past medical history, past surgical history, social history and family history with the patient and they are unchanged from previous note.  ALLERGIES:  is allergic to hydrocodone-acetaminophen.  MEDICATIONS:  Current Outpatient Prescriptions  Medication Sig Dispense Refill  . Cholecalciferol (VITAMIN D) 2000 units tablet Take one tablet twice daily.    Marland Kitchen dexamethasone (DECADRON) 4 MG tablet Take 5 tablets at 6 am on the day of treatment, every 3 weeks 30 tablet 1  . Ibuprofen (ADVIL PO) Take by mouth as needed.    . lidocaine-prilocaine (EMLA) cream Apply to affected area once 30 g 3  . ondansetron (ZOFRAN) 8 MG tablet Take 1 tablet (8 mg total) by mouth 2 (two) times daily as needed for refractory nausea / vomiting. Start on day 3 after chemo. 30 tablet 1  . ondansetron (ZOFRAN-ODT) 4 MG disintegrating tablet DIS ONE T PO Q 6 H PRN FOR UP TO 7 DAYS  0  . prochlorperazine (COMPAZINE) 10 MG tablet Take 1 tablet (10 mg total) by mouth every 6 (six) hours as needed (Nausea or vomiting). 60 tablet 1  . simethicone (MYLICON) 161 MG chewable tablet Chew 125 mg by mouth every  6 (six) hours as needed for flatulence.     No current facility-administered medications for this visit.     PHYSICAL EXAMINATION: ECOG PERFORMANCE STATUS: 1 - Symptomatic but completely ambulatory  Vitals:   03/12/17 0929   BP: 134/79  Pulse: (!) 108  Resp: 17  Temp: 98.4 F (36.9 C)  SpO2: 100%   Filed Weights   03/12/17 0929  Weight: 134 lb 8 oz (61 kg)    GENERAL:alert, no distress and comfortable SKIN: skin color, texture, turgor are normal, no rashes or significant lesions EYES: normal, Conjunctiva are pink and non-injected, sclera clear OROPHARYNX:no exudate, no erythema and lips, buccal mucosa, and tongue normal  NECK: supple, thyroid normal size, non-tender, without nodularity LYMPH:  no palpable lymphadenopathy in the cervical, axillary or inguinal LUNGS: clear to auscultation and percussion with normal breathing effort HEART: regular rate & rhythm and no murmurs and no lower extremity edema ABDOMEN:abdomen soft, non-tender and normal bowel sounds Musculoskeletal:no cyanosis of digits and no clubbing  NEURO: alert & oriented x 3 with fluent speech, no focal motor/sensory deficits  LABORATORY DATA:  I have reviewed the data as listed    Component Value Date/Time   NA 141 03/12/2017 0903   K 4.1 03/12/2017 0903   CL 103 12/01/2016 1242   CO2 26 03/12/2017 0903   GLUCOSE 109 03/12/2017 0903   BUN 13.7 03/12/2017 0903   CREATININE 0.8 03/12/2017 0903   CALCIUM 10.1 03/12/2017 0903   PROT 8.0 03/12/2017 0903   ALBUMIN 3.9 03/12/2017 0903   AST 35 (H) 03/12/2017 0903   ALT 37 03/12/2017 0903   ALKPHOS 164 (H) 03/12/2017 0903   BILITOT 0.33 03/12/2017 0903   GFRNONAA >60 12/01/2016 1242   GFRAA >60 12/01/2016 1242    No results found for: SPEP, UPEP  Lab Results  Component Value Date   WBC 10.8 (H) 03/12/2017   NEUTROABS 10.2 (H) 03/12/2017   HGB 11.9 03/12/2017   HCT 36.2 03/12/2017   MCV 87.4 03/12/2017   PLT 158 03/12/2017      Chemistry      Component Value Date/Time   NA 141 03/12/2017 0903   K 4.1 03/12/2017 0903   CL 103 12/01/2016 1242   CO2 26 03/12/2017 0903   BUN 13.7 03/12/2017 0903   CREATININE 0.8 03/12/2017 0903      Component Value Date/Time    CALCIUM 10.1 03/12/2017 0903   ALKPHOS 164 (H) 03/12/2017 0903   AST 35 (H) 03/12/2017 0903   ALT 37 03/12/2017 0903   BILITOT 0.33 03/12/2017 0903      ASSESSMENT & PLAN:  Cancer of left fallopian tube (HCC) Overall, she has problems with hydration and oral intake within the first few days after chemotherapy We discussed various strategies including adding dexamethasone daily for a few days afterwards We also discussed options for hydration including IV fluids if needed Since we delay her treatment cycle by more than 3 weeks, she has more time to recover from side effects of treatment I will resume treatment today without delay and continue with similar dose as before  Elevated liver enzymes She has elevated liver enzymes likely related to side effects of chemotherapy I will continue reduced dose Taxol  Chronic hip pain, left She has persistent leg pain I recommend referral to physical therapy and rehab   No orders of the defined types were placed in this encounter.  All questions were answered. The patient knows to call the clinic with any problems, questions  or concerns. No barriers to learning was detected. I spent 15 minutes counseling the patient face to face. The total time spent in the appointment was 20 minutes and more than 50% was on counseling and review of test results     Heath Lark, MD 03/12/2017 10:02 AM

## 2017-03-12 NOTE — Patient Instructions (Signed)
[facility] Discharge Instructions for Patients Receiving Chemotherapy  Today you received the following chemotherapy agents :  Taxol,  Carboplatin.  To help prevent nausea and vomiting after your treatment, we encourage you to take your nausea medication as prescribed.   If you develop nausea and vomiting that is not controlled by your nausea medication, call the clinic.   BELOW ARE SYMPTOMS THAT SHOULD BE REPORTED IMMEDIATELY:  *FEVER GREATER THAN 100.5 F  *CHILLS WITH OR WITHOUT FEVER  NAUSEA AND VOMITING THAT IS NOT CONTROLLED WITH YOUR NAUSEA MEDICATION  *UNUSUAL SHORTNESS OF BREATH  *UNUSUAL BRUISING OR BLEEDING  TENDERNESS IN MOUTH AND THROAT WITH OR WITHOUT PRESENCE OF ULCERS  *URINARY PROBLEMS  *BOWEL PROBLEMS  UNUSUAL RASH Items with * indicate a potential emergency and should be followed up as soon as possible.  Feel free to call the clinic you have any questions or concerns. The clinic phone number is [phone].  Please show the CHEMO ALERT CARD at check-in to the Emergency Department and triage nurse.   

## 2017-03-12 NOTE — Assessment & Plan Note (Signed)
She has elevated liver enzymes likely related to side effects of chemotherapy I will continue reduced dose Taxol

## 2017-03-12 NOTE — Assessment & Plan Note (Signed)
Overall, she has problems with hydration and oral intake within the first few days after chemotherapy We discussed various strategies including adding dexamethasone daily for a few days afterwards We also discussed options for hydration including IV fluids if needed Since we delay her treatment cycle by more than 3 weeks, she has more time to recover from side effects of treatment I will resume treatment today without delay and continue with similar dose as before

## 2017-03-12 NOTE — Assessment & Plan Note (Signed)
She has persistent leg pain I recommend referral to physical therapy and rehab

## 2017-03-12 NOTE — Telephone Encounter (Signed)
Scheduled appt per 8/31 los - Gave patient AVS and calender per los.

## 2017-04-06 ENCOUNTER — Ambulatory Visit (HOSPITAL_BASED_OUTPATIENT_CLINIC_OR_DEPARTMENT_OTHER): Payer: Self-pay

## 2017-04-06 ENCOUNTER — Ambulatory Visit (HOSPITAL_BASED_OUTPATIENT_CLINIC_OR_DEPARTMENT_OTHER): Payer: Self-pay | Admitting: Hematology and Oncology

## 2017-04-06 ENCOUNTER — Ambulatory Visit: Payer: Self-pay

## 2017-04-06 ENCOUNTER — Other Ambulatory Visit (HOSPITAL_BASED_OUTPATIENT_CLINIC_OR_DEPARTMENT_OTHER): Payer: Self-pay

## 2017-04-06 DIAGNOSIS — D61818 Other pancytopenia: Secondary | ICD-10-CM

## 2017-04-06 DIAGNOSIS — C5702 Malignant neoplasm of left fallopian tube: Secondary | ICD-10-CM

## 2017-04-06 DIAGNOSIS — G620 Drug-induced polyneuropathy: Secondary | ICD-10-CM

## 2017-04-06 DIAGNOSIS — T451X5A Adverse effect of antineoplastic and immunosuppressive drugs, initial encounter: Secondary | ICD-10-CM

## 2017-04-06 DIAGNOSIS — Z5111 Encounter for antineoplastic chemotherapy: Secondary | ICD-10-CM

## 2017-04-06 DIAGNOSIS — R748 Abnormal levels of other serum enzymes: Secondary | ICD-10-CM

## 2017-04-06 LAB — CBC WITH DIFFERENTIAL/PLATELET
BASO%: 0.6 % (ref 0.0–2.0)
BASOS ABS: 0 10*3/uL (ref 0.0–0.1)
EOS ABS: 0 10*3/uL (ref 0.0–0.5)
EOS%: 0.1 % (ref 0.0–7.0)
HCT: 34 % — ABNORMAL LOW (ref 34.8–46.6)
HGB: 11.2 g/dL — ABNORMAL LOW (ref 11.6–15.9)
LYMPH%: 16.6 % (ref 14.0–49.7)
MCH: 30.5 pg (ref 25.1–34.0)
MCHC: 33 g/dL (ref 31.5–36.0)
MCV: 92.4 fL (ref 79.5–101.0)
MONO#: 0.1 10*3/uL (ref 0.1–0.9)
MONO%: 2.6 % (ref 0.0–14.0)
NEUT#: 3.1 10*3/uL (ref 1.5–6.5)
NEUT%: 80.1 % — ABNORMAL HIGH (ref 38.4–76.8)
PLATELETS: 189 10*3/uL (ref 145–400)
RBC: 3.68 10*6/uL — AB (ref 3.70–5.45)
RDW: 18.1 % — ABNORMAL HIGH (ref 11.2–14.5)
WBC: 3.8 10*3/uL — AB (ref 3.9–10.3)
lymph#: 0.6 10*3/uL — ABNORMAL LOW (ref 0.9–3.3)

## 2017-04-06 LAB — COMPREHENSIVE METABOLIC PANEL
ALBUMIN: 3.9 g/dL (ref 3.5–5.0)
ALK PHOS: 176 U/L — AB (ref 40–150)
ALT: 58 U/L — ABNORMAL HIGH (ref 0–55)
ANION GAP: 9 meq/L (ref 3–11)
AST: 57 U/L — ABNORMAL HIGH (ref 5–34)
BILIRUBIN TOTAL: 0.31 mg/dL (ref 0.20–1.20)
BUN: 9.4 mg/dL (ref 7.0–26.0)
CO2: 26 meq/L (ref 22–29)
Calcium: 10 mg/dL (ref 8.4–10.4)
Chloride: 106 mEq/L (ref 98–109)
Creatinine: 0.7 mg/dL (ref 0.6–1.1)
Glucose: 99 mg/dl (ref 70–140)
POTASSIUM: 4 meq/L (ref 3.5–5.1)
SODIUM: 141 meq/L (ref 136–145)
Total Protein: 7.9 g/dL (ref 6.4–8.3)

## 2017-04-06 MED ORDER — DEXAMETHASONE SODIUM PHOSPHATE 100 MG/10ML IJ SOLN
20.0000 mg | Freq: Once | INTRAMUSCULAR | Status: AC
  Administered 2017-04-06: 20 mg via INTRAVENOUS
  Filled 2017-04-06: qty 2

## 2017-04-06 MED ORDER — DIPHENHYDRAMINE HCL 50 MG/ML IJ SOLN
INTRAMUSCULAR | Status: AC
  Filled 2017-04-06: qty 1

## 2017-04-06 MED ORDER — SODIUM CHLORIDE 0.9 % IV SOLN
Freq: Once | INTRAVENOUS | Status: AC
  Administered 2017-04-06: 10:00:00 via INTRAVENOUS

## 2017-04-06 MED ORDER — PACLITAXEL CHEMO INJECTION 300 MG/50ML
116.6667 mg/m2 | Freq: Once | INTRAVENOUS | Status: AC
  Administered 2017-04-06: 186 mg via INTRAVENOUS
  Filled 2017-04-06: qty 31

## 2017-04-06 MED ORDER — DIPHENHYDRAMINE HCL 50 MG/ML IJ SOLN
50.0000 mg | Freq: Once | INTRAMUSCULAR | Status: AC
  Administered 2017-04-06: 50 mg via INTRAVENOUS

## 2017-04-06 MED ORDER — FAMOTIDINE IN NACL 20-0.9 MG/50ML-% IV SOLN
INTRAVENOUS | Status: AC
  Filled 2017-04-06: qty 50

## 2017-04-06 MED ORDER — SODIUM CHLORIDE 0.9 % IV SOLN
600.0000 mg | Freq: Once | INTRAVENOUS | Status: AC
  Administered 2017-04-06: 600 mg via INTRAVENOUS
  Filled 2017-04-06: qty 60

## 2017-04-06 MED ORDER — SODIUM CHLORIDE 0.9% FLUSH
10.0000 mL | Freq: Once | INTRAVENOUS | Status: AC
  Administered 2017-04-06: 10 mL
  Filled 2017-04-06: qty 10

## 2017-04-06 MED ORDER — PALONOSETRON HCL INJECTION 0.25 MG/5ML
0.2500 mg | Freq: Once | INTRAVENOUS | Status: AC
  Administered 2017-04-06: 0.25 mg via INTRAVENOUS

## 2017-04-06 MED ORDER — FAMOTIDINE IN NACL 20-0.9 MG/50ML-% IV SOLN
20.0000 mg | Freq: Once | INTRAVENOUS | Status: AC
  Administered 2017-04-06: 20 mg via INTRAVENOUS

## 2017-04-06 MED ORDER — SODIUM CHLORIDE 0.9% FLUSH
10.0000 mL | INTRAVENOUS | Status: DC | PRN
  Administered 2017-04-06: 10 mL
  Filled 2017-04-06: qty 10

## 2017-04-06 MED ORDER — PALONOSETRON HCL INJECTION 0.25 MG/5ML
INTRAVENOUS | Status: AC
  Filled 2017-04-06: qty 5

## 2017-04-06 MED ORDER — HEPARIN SOD (PORK) LOCK FLUSH 100 UNIT/ML IV SOLN
500.0000 [IU] | Freq: Once | INTRAVENOUS | Status: AC | PRN
  Administered 2017-04-06: 500 [IU]
  Filled 2017-04-06: qty 5

## 2017-04-06 NOTE — Patient Instructions (Signed)
[facility] Discharge Instructions for Patients Receiving Chemotherapy  Today you received the following chemotherapy agents :  Taxol,  Carboplatin.  To help prevent nausea and vomiting after your treatment, we encourage you to take your nausea medication as prescribed.   If you develop nausea and vomiting that is not controlled by your nausea medication, call the clinic.   BELOW ARE SYMPTOMS THAT SHOULD BE REPORTED IMMEDIATELY:  *FEVER GREATER THAN 100.5 F  *CHILLS WITH OR WITHOUT FEVER  NAUSEA AND VOMITING THAT IS NOT CONTROLLED WITH YOUR NAUSEA MEDICATION  *UNUSUAL SHORTNESS OF BREATH  *UNUSUAL BRUISING OR BLEEDING  TENDERNESS IN MOUTH AND THROAT WITH OR WITHOUT PRESENCE OF ULCERS  *URINARY PROBLEMS  *BOWEL PROBLEMS  UNUSUAL RASH Items with * indicate a potential emergency and should be followed up as soon as possible.  Feel free to call the clinic you have any questions or concerns. The clinic phone number is [phone].  Please show the CHEMO ALERT CARD at check-in to the Emergency Department and triage nurse.   

## 2017-04-07 ENCOUNTER — Encounter: Payer: Self-pay | Admitting: Hematology and Oncology

## 2017-04-07 ENCOUNTER — Telehealth: Payer: Self-pay | Admitting: Hematology and Oncology

## 2017-04-07 DIAGNOSIS — G620 Drug-induced polyneuropathy: Secondary | ICD-10-CM | POA: Insufficient documentation

## 2017-04-07 DIAGNOSIS — T451X5A Adverse effect of antineoplastic and immunosuppressive drugs, initial encounter: Secondary | ICD-10-CM

## 2017-04-07 NOTE — Assessment & Plan Note (Signed)
She has elevated liver enzymes likely related to side effects of chemotherapy I will continue reduced dose Taxol

## 2017-04-07 NOTE — Assessment & Plan Note (Signed)
She has acquired pancytopenia She is not symptomatic I suspect it would improve after discontinuation of treatment

## 2017-04-07 NOTE — Telephone Encounter (Signed)
No 9/25 los

## 2017-04-07 NOTE — Assessment & Plan Note (Signed)
She tolerated last cycle of treatment well Denies major side effects We will proceed with final treatment I will get her scheduled to get GYN follow-up in the near future We will also asked whether we can get the port removed in the near future

## 2017-04-07 NOTE — Progress Notes (Signed)
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 Talbert Nan after she started to have vaginal discharge. Pelvic US showed: 7.4 x 6.4 x 6.6 cm solid mass in the left adnexa with irregular borders and blood flow. The Pulsatile index is <1 and the resistive index is <0.4. There is a large amount of pelvic fluid. Concerning for possible ovarian cancer.        08/27/2016 Tumor Marker    Patient's tumor was tested for the following markers: CA125. Results of the tumor marker test revealed 1577      09/09/2016 Pathology Results    Primary Tumor Site(s):Left fallopian tube Tumor Size:Greatest dimension (cm): 3.5 cm Histologic Type:Serous high-grade carcinoma Histologic Grade:G3: Poorly differentiated  Ovarian Surface Involvement:Present  Specimen(s):Right ovary  Right Ovary - Extent:Not involved  Specimen(s):Left ovary  Left Ovary - Extent:Involved  Specimen(s):Right fallopian tube  Right Fallopian Tube - Extent:Not involved  Specimen(s):Left fallopian tube  Left Fallopian Tube - Extent:Involved  Specimen(s):Omentum  Omentum - Extent:Not involved  Specimen(s):Uterus  Uterus - Extent:Not involved  Specimen(s):Cervix  Cervix - Extent:Not involved  Specimen(s):Peritoneum  Peritoneum - Extent:Not involved  Peritoneal Ascitic Fluid:Negative for malignancy (normal / benign)  Pleural Fluid:Not performed / unknown  LYMPH NODES  Pelvic Lymph Nodes:   Number of Pelvic Lymph Nodes Examined:Specify number: 17  Number of Pelvic Lymph Nodes Involved:None identified  Para-aortic Lymph Nodes:  Number of Para-aortic Lymph Nodes Examined:Specify number: 2  Number of Para-aortic Lymph Nodes Involved:None identified  STAGE (pTNM, AJCC 7th ed.)  Primary Tumor (pT):Fallopian tube  Fallopian Tube Primary Tumor (pT):pT2a: Extension and / or metastasis to the uterus and / or ovaries  Regional Lymph Nodes (pN):pN0: No regional lymph node metastasis  Distant Metastasis (pM):Not applicable - pM cannot be determined from the submitted specimen(s)  FIGO STAGE (FIGO 2014)  FIGO Stage:IIA: Extension and / or implants on the uterus and / or fallopian tubes and / or ovaries      09/09/2016 Pathology Results    Pelvic fluid, washing: No definitive malignant cells identified. Blood, histiocytes, and reactive mesothelial cells present. Mixed inflammation present. See comment.      09/09/2016 Surgery    Procedures: Bilateral - Robotic Laparoscopy, Surgical; With Bilateral Total Pelvic Lymphadenectomy Peri-Aortic Lymph Node Sample, Sgl/Mult Robotic Bilateral Salpingo-Oophorectomy W/Omentectomy, Total Abd Hysterectomy & Radical Dissection For Debulking;    Findings:  1. 9-10 cm left adnexal mass with rupture of tumor during manipulation 2. Dilated left fallopian tube removed in tact with IOFS: poorly differentiated carcinoma 3. Endometriosis implants on anterior and posterior cul-de-sac 4. Grossly normal omentum, diaphragm, small bowel mesenter 5. Grossly normal uterus and right fallopian tube/ovary 6. IOFS: anterior peritoneal biopsy (endometriosis) // left fallopian tube: poorly differentiated carcinoma 7. 1-2 cm tumor nodule in posterior cul-de-sac; completely resected 8. Non-enlarged lymph nodes 9. R0 resection at end of case 10. Ascites present      11/30/2016 Imaging    1. Nonspecific  trace fluid in the pelvic cul-de-sac. No discrete mass, adenopathy or other specific finding of local tumor recurrence in the pelvis. 2. No evidence  of metastatic disease in the chest or abdomen.      12/01/2016 Procedure    Successful placement of a right IJ approach Power Port with ultrasound and fluoroscopic guidance. The catheter is ready for use      12/02/2016 Tumor Marker    Patient's tumor was tested for the following markers: CA125 Results of the tumor marker test revealed 7.8      12/04/2016 -  Chemotherapy    She received carboplatin and Taxol      12/21/2016 Genetic Testing    CDKN2A (p16INK4a) c.413G>A (p.Arg138Lys) and MLH1 c.290A>G (p.Tyr97Cys) VUS found on genetic testing on the [facility] panel.  The [facility] gene panel offered by [employer] includes sequencing and deletion/duplication testing of the following 28 genes: APC, ATM, BARD1, BMPR1A, BRCA1, BRCA2, BRIP1, CHD1, CDK4, CDKN2A, CHEK2, EPCAM (large rearrangement only), MLH1, MSH2, MSH6, MUTYH, NBN, PALB2, PMS2, PTEN, RAD51C, RAD51D, SMAD4, STK11, and TP53. Sequencing was performed for select regions of POLE and POLD1, and large rearrangement analysis was performed for select regions of GREM1. The report date is December 11, 2016.  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  HRD can be indicated by the presence of a tumor BRCA1 or BRCA2 mutation and/or genomic instability. HRD testing found genomic instability.  The report date is December 21, 2016.        01/18/2017 Adverse Reaction    Dose of Taxol is reduced due to elevated liver enzymes       INTERVAL HISTORY: Please see below for problem oriented charting. She returns for final cycle of treatment She has intermittent neuropathy, not worse Denies recent nausea or vomiting No recent changes in bowel habits Overall, she feels fine with recent treatment  REVIEW OF SYSTEMS:   Constitutional: Denies fevers, chills or abnormal weight loss Eyes: Denies  blurriness of vision Ears, nose, mouth, throat, and face: Denies mucositis or sore throat Respiratory: Denies cough, dyspnea or wheezes Cardiovascular: Denies palpitation, chest discomfort or lower extremity swelling Gastrointestinal:  Denies nausea, heartburn or change in bowel habits Skin: Denies abnormal skin rashes Lymphatics: Denies new lymphadenopathy or easy bruising Neurological:Denies numbness, tingling or new weaknesses Behavioral/Psych: Mood is stable, no new changes  All other systems were reviewed with the patient and are negative.  I have reviewed the past medical history, past surgical history, social history and family history with the patient and they are unchanged from previous note.  ALLERGIES:  is allergic to hydrocodone-acetaminophen.  MEDICATIONS:  Current Outpatient Prescriptions  Medication Sig Dispense Refill  . Cholecalciferol (VITAMIN D) 2000 units tablet Take one tablet twice daily.    Marland Kitchen dexamethasone (DECADRON) 4 MG tablet Take 5 tablets at 6 am on the day of treatment, every 3 weeks 30 tablet 1  . Ibuprofen (ADVIL PO) Take by mouth as needed.    . lidocaine-prilocaine (EMLA) cream Apply to affected area once 30 g 3  . ondansetron (ZOFRAN) 8 MG tablet Take 1 tablet (8 mg total) by mouth 2 (two) times daily as needed for refractory nausea / vomiting. Start on day 3 after chemo. 30 tablet 1  . ondansetron (ZOFRAN-ODT) 4 MG disintegrating tablet DIS ONE T PO Q 6 H PRN FOR UP TO 7 DAYS  0  . prochlorperazine (COMPAZINE) 10 MG tablet Take 1 tablet (10 mg total) by mouth every 6 (six) hours as needed (Nausea or vomiting). 60 tablet 1  . simethicone (MYLICON) 125 MG chewable tablet Chew 125 mg by mouth every 6 (six) hours as needed  for flatulence.     No current facility-administered medications for this visit.     PHYSICAL EXAMINATION: ECOG PERFORMANCE STATUS: 1 - Symptomatic but completely ambulatory  Vitals:   04/06/17 0814  BP: 140/72  Pulse: 94  Resp:  17  Temp: 98.4 F (36.9 C)  SpO2: 100%   Filed Weights   04/06/17 0814  Weight: 137 lb 4.8 oz (62.3 kg)    GENERAL:alert, no distress and comfortable SKIN: skin color, texture, turgor are normal, no rashes or significant lesions EYES: normal, Conjunctiva are pink and non-injected, sclera clear OROPHARYNX:no exudate, no erythema and lips, buccal mucosa, and tongue normal  NECK: supple, thyroid normal size, non-tender, without nodularity LYMPH:  no palpable lymphadenopathy in the cervical, axillary or inguinal LUNGS: clear to auscultation and percussion with normal breathing effort HEART: regular rate & rhythm and no murmurs and no lower extremity edema ABDOMEN:abdomen soft, non-tender and normal bowel sounds Musculoskeletal:no cyanosis of digits and no clubbing  NEURO: alert & oriented x 3 with fluent speech, no focal motor/sensory deficits  LABORATORY DATA:  I have reviewed the data as listed    Component Value Date/Time   NA 141 04/06/2017 0758   K 4.0 04/06/2017 0758   CL 103 12/01/2016 1242   CO2 26 04/06/2017 0758   GLUCOSE 99 04/06/2017 0758   BUN 9.4 04/06/2017 0758   CREATININE 0.7 04/06/2017 0758   CALCIUM 10.0 04/06/2017 0758   PROT 7.9 04/06/2017 0758   ALBUMIN 3.9 04/06/2017 0758   AST 57 (H) 04/06/2017 0758   ALT 58 (H) 04/06/2017 0758   ALKPHOS 176 (H) 04/06/2017 0758   BILITOT 0.31 04/06/2017 0758   GFRNONAA >60 12/01/2016 1242   GFRAA >60 12/01/2016 1242    No results found for: SPEP, UPEP  Lab Results  Component Value Date   WBC 3.8 (L) 04/06/2017   NEUTROABS 3.1 04/06/2017   HGB 11.2 (L) 04/06/2017   HCT 34.0 (L) 04/06/2017   MCV 92.4 04/06/2017   PLT 189 04/06/2017      Chemistry      Component Value Date/Time   NA 141 04/06/2017 0758   K 4.0 04/06/2017 0758   CL 103 12/01/2016 1242   CO2 26 04/06/2017 0758   BUN 9.4 04/06/2017 0758   CREATININE 0.7 04/06/2017 0758      Component Value Date/Time   CALCIUM 10.0 04/06/2017 0758    ALKPHOS 176 (H) 04/06/2017 0758   AST 57 (H) 04/06/2017 0758   ALT 58 (H) 04/06/2017 0758   BILITOT 0.31 04/06/2017 0758       ASSESSMENT & PLAN:  Cancer of left fallopian tube ([place]) She tolerated last cycle of treatment well Denies major side effects We will proceed with final treatment I will get her scheduled to get GYN follow-up in the near future We will also asked whether we can get the port removed in the near future  Pancytopenia, acquired ([place]) She has acquired pancytopenia She is not symptomatic I suspect it would improve after discontinuation of treatment  Elevated liver enzymes She has elevated liver enzymes likely related to side effects of chemotherapy I will continue reduced dose Taxol  Peripheral neuropathy due to chemotherapy [facility]) she has mild peripheral neuropathy, likely related to side effects of treatment. It is only mild, not bothering the patient. I will observe for now   No orders of the defined types were placed in this encounter.  All questions were answered. The patient knows to call the clinic with any  problems, questions or concerns. No barriers to learning was detected. I spent 15 minutes counseling the patient face to face. The total time spent in the appointment was 20 minutes and more than 50% was on counseling and review of test results     Heath Lark, MD 04/07/2017 1:07 PM

## 2017-04-07 NOTE — Assessment & Plan Note (Signed)
she has mild peripheral neuropathy, likely related to side effects of treatment. It is only mild, not bothering the patient. I will observe for now 

## 2017-04-13 ENCOUNTER — Telehealth: Payer: Self-pay | Admitting: *Deleted

## 2017-04-13 NOTE — Telephone Encounter (Signed)
Contacted the patient by using [employer], message given to the patient for the appt on October 25th at 3:30pm

## 2017-05-06 ENCOUNTER — Ambulatory Visit: Payer: Self-pay | Attending: Gynecologic Oncology | Admitting: Gynecologic Oncology

## 2017-05-06 ENCOUNTER — Encounter: Payer: Self-pay | Admitting: Gynecologic Oncology

## 2017-05-06 VITALS — BP 145/70 | HR 99 | Temp 97.8°F | Resp 20

## 2017-05-06 DIAGNOSIS — C5702 Malignant neoplasm of left fallopian tube: Secondary | ICD-10-CM | POA: Insufficient documentation

## 2017-05-06 DIAGNOSIS — Z9221 Personal history of antineoplastic chemotherapy: Secondary | ICD-10-CM | POA: Insufficient documentation

## 2017-05-06 NOTE — Patient Instructions (Signed)
You will receive a phone call from the hospital to arrange for your port removal after you have your CT scan.  We will call you with the results of your CT scan.  Plan to follow up in three months or sooner if needed.

## 2017-05-06 NOTE — Progress Notes (Signed)
GYN ONCOLOGY OFFICE VISIT   CHIEF COMPLAINT:  Stage IIA Falopian tube cancer Staged 08/2016  ASSESSMENT/PLAN: Has completed 6 cycles of Taxol paclitaxel therapy.  At this time is doing well. Will order CT of the abdomen and pelvis Follow-up in 3 months Counseled regarding signs and symptoms of recurrent disease  HISTORY OF PRESENT ILLNESS  Oncology History   Laura Washington is a 51 y.o. woman who is seen in consultation at the request of Dr. Talbert Nan for evaluation of a new adnexal mass. She initially presented to her primary provider with complaint of vaginal discharge was noted to have an enlarged uterus vs pelvic mass. This prompted an ultrasound which revealed an adnexal mass and free fluid   HRD testing positive       Cancer of left fallopian tube ([place])   07/28/2016 Initial Diagnosis    She presented to PCP complaining of heavy menstruation for almost 1 year      08/27/2016 Imaging    She was referred to see Dr. Talbert Nan after she started to have vaginal discharge. Pelvic US showed: 7.4 x 6.4 x 6.6 cm solid mass in the left adnexa with irregular borders and blood flow. The Pulsatile index is <1 and the resistive index is <0.4. There is a large amount of pelvic fluid. Concerning for possible ovarian cancer.        08/27/2016 Tumor Marker    Patient's tumor was tested for the following markers: CA125. Results of the tumor marker test revealed 1577      09/09/2016 Surgery    Procedures: Bilateral - Robotic Laparoscopy, Surgical; With Bilateral Total Pelvic Lymphadenectomy Peri-Aortic Lymph Node Sample, Sgl/Mult Robotic Bilateral Salpingo-Oophorectomy W/Omentectomy, Total Abd Hysterectomy & Radical Dissection For Debulking;    Findings:  1. 9-10 cm left adnexal mass with rupture of tumor during manipulation 2. Dilated left fallopian tube removed in tact with IOFS: poorly differentiated carcinoma 3. Endometriosis implants on anterior and posterior cul-de-sac 4. Grossly normal  omentum, diaphragm, small bowel mesenter 5. Grossly normal uterus and right fallopian tube/ovary 6. IOFS: anterior peritoneal biopsy (endometriosis) // left fallopian tube: poorly differentiated carcinoma 7. 1-2 cm tumor nodule in posterior cul-de-sac; completely resected 8. Non-enlarged lymph nodes 9. R0 resection at end of case 10. Ascites present      09/09/2016 Pathology Results    Pelvic fluid, washing: No definitive malignant cells identified. Blood, histiocytes, and reactive mesothelial cells present. Mixed inflammation present. See comment.      09/09/2016 Cancer Staging    Cancer Staging Cancer of left fallopian tube [facility]) Staging form: Ovary, Fallopian Tube, and Primary Peritoneal Carcinoma, AJCC 8th Edition - Pathologic stage from 09/23/2016: FIGO Stage IIA (pT2a, pN0, cM0) - Signed by Heath Lark, MD on 12/02/2016       11/30/2016 Imaging    1. Nonspecific trace fluid in the pelvic cul-de-sac. No discrete mass, adenopathy or other specific finding of local tumor recurrence in the pelvis. 2. No evidence of metastatic disease in the chest or abdomen.      12/01/2016 Procedure    Successful placement of a right IJ approach Power Port with ultrasound and fluoroscopic guidance. The catheter is ready for use      12/02/2016 Tumor Marker    Patient's tumor was tested for the following markers: CA125 Results of the tumor marker test revealed 7.8      12/04/2016 -  Chemotherapy    She received carboplatin and Taxol      12/21/2016 Genetic Testing  CDKN2A (p16INK4a) c.413G>A (p.Arg138Lys) and MLH1 c.290A>G (p.Tyr97Cys) VUS found on genetic testing on the [facility] panel.  The [facility] gene panel offered by [employer] includes sequencing and deletion/duplication testing of the following 28 genes: APC, ATM, BARD1, BMPR1A, BRCA1, BRCA2, BRIP1, CHD1, CDK4, CDKN2A, CHEK2, EPCAM (large rearrangement only), MLH1, MSH2, MSH6, MUTYH, NBN, PALB2, PMS2, PTEN, RAD51C,  RAD51D, SMAD4, STK11, and TP53. Sequencing was performed for select regions of POLE and POLD1, and large rearrangement analysis was performed for select regions of GREM1. The report date is December 11, 2016.  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  HRD can be indicated by the presence of a tumor BRCA1 or BRCA2 mutation and/or genomic instability. HRD testing found genomic instability.  The report date is December 21, 2016.        01/18/2017 Adverse Reaction    Dose of Taxol is reduced due to elevated liver enzymes      Past Medical History:  Diagnosis Date  . Family history of breast cancer   . Migraines   . Ovarian cancer [facility])    Past Surgical History:  Procedure Laterality Date  . IR FLUORO GUIDE PORT INSERTION RIGHT  12/01/2016  . IR US GUIDE VASC ACCESS RIGHT  12/01/2016  . TOTAL ABDOMINAL HYSTERECTOMY W/ BILATERAL SALPINGOOPHORECTOMY     ROS:  Mild numbness of the fingers, no nausea or emesis, reports weight gain, no vaginal or rectal bleeding.    Alopecia of the scalp brows and eyelids. Otherwise ROS is non contributory.  PHYSICAL EXAMINATION: BP (!) 145/70 (BP Location: Left Arm, Patient Position: Sitting)   Pulse 99   Temp 97.8 F (36.6 C) (Oral)   Resp 20   LMP 08/09/2016   SpO2 100%  Wt Readings from Last 3 Encounters:  04/06/17 137 lb 4.8 oz (62.3 kg)  03/12/17 134 lb 8 oz (61 kg)  02/16/17 127 lb 11.2 oz (57.9 kg)  CHEST:  CTA CV: RRR  ABD:  Soft NT, Port sites without masses,

## 2017-05-13 ENCOUNTER — Encounter (HOSPITAL_COMMUNITY): Payer: Self-pay

## 2017-05-13 ENCOUNTER — Ambulatory Visit (HOSPITAL_COMMUNITY)
Admission: RE | Admit: 2017-05-13 | Discharge: 2017-05-13 | Disposition: A | Discharge status: Home / Self Care | Payer: Self-pay | Source: Ambulatory Visit | Attending: Gynecologic Oncology | Admitting: Gynecologic Oncology

## 2017-05-13 DIAGNOSIS — C5702 Malignant neoplasm of left fallopian tube: Secondary | ICD-10-CM | POA: Insufficient documentation

## 2017-05-13 MED ORDER — IOPAMIDOL (ISOVUE-300) INJECTION 61%
100.0000 mL | Freq: Once | INTRAVENOUS | Status: AC | PRN
  Administered 2017-05-13: 100 mL via INTRAVENOUS

## 2017-05-13 MED ORDER — IOPAMIDOL (ISOVUE-300) INJECTION 61%
INTRAVENOUS | Status: AC
  Administered 2017-05-13: 100 mL via INTRAVENOUS
  Filled 2017-05-13: qty 100

## 2017-05-18 ENCOUNTER — Other Ambulatory Visit: Payer: Self-pay | Admitting: Radiology

## 2017-05-18 ENCOUNTER — Other Ambulatory Visit: Payer: Self-pay | Admitting: Student

## 2017-05-19 ENCOUNTER — Ambulatory Visit (HOSPITAL_COMMUNITY)
Admission: RE | Admit: 2017-05-19 | Discharge: 2017-05-19 | Disposition: A | Discharge status: Home / Self Care | Payer: Self-pay | Source: Ambulatory Visit | Attending: Gynecologic Oncology | Admitting: Gynecologic Oncology

## 2017-05-19 ENCOUNTER — Encounter (HOSPITAL_COMMUNITY): Payer: Self-pay

## 2017-05-19 DIAGNOSIS — Z452 Encounter for adjustment and management of vascular access device: Secondary | ICD-10-CM | POA: Insufficient documentation

## 2017-05-19 DIAGNOSIS — G43909 Migraine, unspecified, not intractable, without status migrainosus: Secondary | ICD-10-CM | POA: Insufficient documentation

## 2017-05-19 DIAGNOSIS — C5702 Malignant neoplasm of left fallopian tube: Secondary | ICD-10-CM

## 2017-05-19 DIAGNOSIS — Z8543 Personal history of malignant neoplasm of ovary: Secondary | ICD-10-CM | POA: Insufficient documentation

## 2017-05-19 HISTORY — PX: IR REMOVAL TUN ACCESS W/ PORT W/O FL MOD SED: IMG2290

## 2017-05-19 LAB — CBC
HEMATOCRIT: 35.8 % — AB (ref 36.0–46.0)
HEMOGLOBIN: 12 g/dL (ref 12.0–15.0)
MCH: 31.7 pg (ref 26.0–34.0)
MCHC: 33.5 g/dL (ref 30.0–36.0)
MCV: 94.7 fL (ref 78.0–100.0)
Platelets: 195 10*3/uL (ref 150–400)
RBC: 3.78 MIL/uL — AB (ref 3.87–5.11)
RDW: 14.7 % (ref 11.5–15.5)
WBC: 4 10*3/uL (ref 4.0–10.5)

## 2017-05-19 LAB — PROTIME-INR
INR: 0.85
Prothrombin Time: 11.5 seconds (ref 11.4–15.2)

## 2017-05-19 MED ORDER — CEFAZOLIN SODIUM-DEXTROSE 2-4 GM/100ML-% IV SOLN
2.0000 g | Freq: Once | INTRAVENOUS | Status: AC
  Administered 2017-05-19: 2 g via INTRAVENOUS

## 2017-05-19 MED ORDER — LIDOCAINE HCL 1 % IJ SOLN
INTRAMUSCULAR | Status: AC
  Filled 2017-05-19: qty 20

## 2017-05-19 MED ORDER — MIDAZOLAM HCL 2 MG/2ML IJ SOLN
INTRAMUSCULAR | Status: AC | PRN
  Administered 2017-05-19 (×2): 1 mg via INTRAVENOUS

## 2017-05-19 MED ORDER — SODIUM CHLORIDE 0.9 % IV SOLN: INTRAVENOUS | Status: DC

## 2017-05-19 MED ORDER — CEFAZOLIN SODIUM-DEXTROSE 2-4 GM/100ML-% IV SOLN
INTRAVENOUS | Status: AC
  Administered 2017-05-19: 2 g via INTRAVENOUS
  Filled 2017-05-19: qty 100

## 2017-05-19 MED ORDER — FENTANYL CITRATE (PF) 100 MCG/2ML IJ SOLN
INTRAMUSCULAR | Status: AC | PRN
  Administered 2017-05-19 (×2): 50 ug via INTRAVENOUS

## 2017-05-19 MED ORDER — LIDOCAINE HCL 1 % IJ SOLN
INTRAMUSCULAR | Status: AC | PRN
  Administered 2017-05-19: 20 mL

## 2017-05-19 MED ORDER — FENTANYL CITRATE (PF) 100 MCG/2ML IJ SOLN
INTRAMUSCULAR | Status: AC
  Filled 2017-05-19: qty 2

## 2017-05-19 MED ORDER — MIDAZOLAM HCL 2 MG/2ML IJ SOLN
INTRAMUSCULAR | Status: AC
  Filled 2017-05-19: qty 2

## 2017-05-19 MED ORDER — LIDOCAINE HCL 1 % IJ SOLN
INTRAMUSCULAR | Status: AC | PRN
  Administered 2017-05-19: 10 mL

## 2017-05-19 NOTE — Procedures (Signed)
Interventional Radiology Procedure Note  Procedure: Removal of right IJ port.  Removed in its entirety.  Complications: None Recommendations:  - Ok to shower tomorrow - Do not submerge for 7 days - Routine wound care   Signed,  Dulcy Fanny. Earleen Newport, DO

## 2017-05-19 NOTE — H&P (Signed)
Laura Washington is an 51 y.o. female.   Referring physician: Dr. Janie Morning [facility] OP Chief Complaint: "I'm getting my port out" HPI: Patient familiar to IR service from prior Port-A-Cath placement on 12/01/16. She has a history of left fallopian tube carcinoma and is status post treatment. Recent CT reveals no residual/active disease. She presents today for Port-A-Cath removal.  Past Medical History:  Diagnosis Date  . Family history of breast cancer   . Migraines   . Ovarian cancer [facility])     Past Surgical History:  Procedure Laterality Date  . IR FLUORO GUIDE PORT INSERTION RIGHT  12/01/2016  . IR US GUIDE VASC ACCESS RIGHT  12/01/2016  . TOTAL ABDOMINAL HYSTERECTOMY W/ BILATERAL SALPINGOOPHORECTOMY      Family History  Problem Relation Age of Onset  . Hypertension Mother   . Thyroid cancer Maternal Uncle   . Breast cancer Cousin        maternal 1/2 uncle's daughter dx under 36   Social History:  reports that  has never smoked. she has never used smokeless tobacco. She reports that she does not drink alcohol or use drugs.  Allergies:  Allergies  Allergen Reactions  . Hydrocodone-Acetaminophen Nausea And Vomiting     (Not in a hospital admission)  Results for orders placed or performed during the hospital encounter of 05/19/17 (from the past 48 hour(s))  CBC     Status: Abnormal   Collection Time: 05/19/17  7:42 AM  Result Value Ref Range   WBC 4.0 4.0 - 10.5 K/uL   RBC 3.78 (L) 3.87 - 5.11 MIL/uL   Hemoglobin 12.0 12.0 - 15.0 g/dL   HCT 35.8 (L) 36.0 - 46.0 %   MCV 94.7 78.0 - 100.0 fL   MCH 31.7 26.0 - 34.0 pg   MCHC 33.5 30.0 - 36.0 g/dL   RDW 14.7 11.5 - 15.5 %   Platelets 195 150 - 400 K/uL   No results found.  ROS denies fever, headache, chest pain, dyspnea, cough, abdominal/back pain, nausea, vomiting or bleeding  Blood pressure 138/82, pulse 100, temperature (!) 97.5 F (36.4 C), temperature source Oral, resp. rate 16, last menstrual period 08/09/2016,  SpO2 100 %.   Physical Exam awake, alert. Chest clear to auscultation bilaterally. Clean, intact right chest wall Port-A-Cath, heart with regular rate and rhythm, abdomen soft, positive bowel sounds, nontender. No lower extremity edema.   Assessment/Plan: Patient with history of left fallopian tube carcinoma, status post treatment. Recent imaging studies with no evidence of active disease/recurrence. She presents today for Port-A-Cath removal. Details/risks of procedure, including but not limited to, internal bleeding, infection, injury to adjacent structures discussed with patient via an interpreter with her understanding and consent.  Approximately 20 minutes were spent face-to-face with the patient discussing details of above procedure.   Autumn Messing, PA-C 05/19/2017, 8:30 AM

## 2017-05-19 NOTE — Discharge Instructions (Signed)
Moderate Conscious Sedation, Adult, Care After These instructions provide you with information about caring for yourself after your procedure. Your health care provider may also give you more specific instructions. Your treatment has been planned according to current medical practices, but problems sometimes occur. Call your health care provider if you have any problems or questions after your procedure. What can I expect after the procedure? After your procedure, it is common:  To feel sleepy for several hours.  To feel clumsy and have poor balance for several hours.  To have poor judgment for several hours.  To vomit if you eat too soon.  Follow these instructions at home: For at least 24 hours after the procedure:   Do not: ? Participate in activities where you could fall or become injured. ? Drive. ? Use heavy machinery. ? Drink alcohol. ? Take sleeping pills or medicines that cause drowsiness. ? Make important decisions or sign legal documents. ? Take care of children on your own.  Rest. Eating and drinking  Follow the diet recommended by your health care provider.  If you vomit: ? Drink water, juice, or soup when you can drink without vomiting. ? Make sure you have little or no nausea before eating solid foods. General instructions  Have a responsible adult stay with you until you are awake and alert.  Take over-the-counter and prescription medicines only as told by your health care provider.  If you smoke, do not smoke without supervision.  Keep all follow-up visits as told by your health care provider. This is important. Contact a health care provider if:  You keep feeling nauseous or you keep vomiting.  You feel light-headed.  You develop a rash.  You have a fever. Get help right away if:  You have trouble breathing. This information is not intended to replace advice given to you by your health care provider. Make sure you discuss any questions you have  with your health care provider. Document Released: 04/19/2013 Document Revised: 12/02/2015 Document Reviewed: 10/19/2015 Elsevier Interactive Patient Education  2018 [place] Removal, Care After Refer to this sheet in the next few weeks. These instructions provide you with information about caring for yourself after your procedure. Your health care provider may also give you more specific instructions. Your treatment has been planned according to current medical practices, but problems sometimes occur. Call your health care provider if you have any problems or questions after your procedure. What can I expect after the procedure? After the procedure, it is common to have:  Soreness or pain near your incision.  Some swelling or bruising near your incision.  Follow these instructions at home: Medicines  Take over-the-counter and prescription medicines only as told by your health care provider.  If you were prescribed an antibiotic medicine, take it as told by your health care provider. Do not stop taking the antibiotic even if you start to feel better. Bathing  Do not take baths, swim, or use a hot tub until your health care provider approves. Ask your health care provider if you can take showers. You may only be allowed to take sponge baths for bathing.  You may shower tomorrow 05/20/17. Incision care  Follow instructions from your health care provider about how to take care of your incision. Make sure you: ? Wash your hands with soap and water before you change your bandage (dressing). If soap and water are not available, use hand sanitizer. ? Change your dressing as told by  your health care provider.  You may remove dressing tomorrow 05/20/18 ? Keep your dressing dry. ? Leave stitches (sutures), skin glue, or adhesive strips in place. These skin closures may need to stay in place for 2 weeks or longer. If adhesive strip edges start to loosen and curl up, you may  trim the loose edges. Do not remove adhesive strips completely unless your health care provider tells you to do that.  Check your incision area every day for signs of infection. Check for: ? More redness, swelling, or pain. ? More fluid or blood. ? Warmth. ? Pus or a bad smell. Driving  If you received a sedative, do not drive for 24 hours after the procedure.  If you did not receive a sedative, ask your health care provider when it is safe to drive. Activity  Return to your normal activities as told by your health care provider. Ask your health care provider what activities are safe for you.  Until your health care provider says it is safe: ? Do not lift anything that is heavier than 10 lb (4.5 kg). ? Do not do activities that involve lifting your arms over your head. General instructions  Do not use any tobacco products, such as cigarettes, chewing tobacco, and e-cigarettes. Tobacco can delay healing. If you need help quitting, ask your health care provider.  Keep all follow-up visits as told by your health care provider. This is important. Contact a health care provider if:  You have more redness, swelling, or pain around your incision.  You have more fluid or blood coming from your incision.  Your incision feels warm to the touch.  You have pus or a bad smell coming from your incision.  You have a fever.  You have pain that is not relieved by your pain medicine. Get help right away if:  You have chest pain.  You have difficulty breathing. This information is not intended to replace advice given to you by your health care provider. Make sure you discuss any questions you have with your health care provider. Document Released: 06/10/2015 Document Revised: 12/05/2015 Document Reviewed: 04/03/2015 Elsevier Interactive Patient Education  [employer].

## 2017-05-19 NOTE — Progress Notes (Signed)
Patient pre-procedure completed with interpreter Dimas Millin assistance.

## 2017-08-02 ENCOUNTER — Telehealth: Payer: Self-pay | Admitting: *Deleted

## 2017-08-02 NOTE — Telephone Encounter (Signed)
Called the patient using [place] at [phone] the interpreter ID was [phone]. Called and left a message that Dr. Skeet Latch will no be in the office on January 31st, that Dr. Denman George will see her.

## 2017-08-12 ENCOUNTER — Inpatient Hospital Stay: Payer: BLUE CROSS/BLUE SHIELD

## 2017-08-12 ENCOUNTER — Inpatient Hospital Stay: Payer: BLUE CROSS/BLUE SHIELD | Attending: Gynecologic Oncology | Admitting: Gynecologic Oncology

## 2017-08-12 ENCOUNTER — Encounter: Payer: Self-pay | Admitting: Gynecologic Oncology

## 2017-08-12 VITALS — BP 142/78 | HR 100 | Temp 97.4°F | Resp 101 | Ht 63.0 in | Wt 149.1 lb

## 2017-08-12 DIAGNOSIS — Z90722 Acquired absence of ovaries, bilateral: Secondary | ICD-10-CM

## 2017-08-12 DIAGNOSIS — Z90710 Acquired absence of both cervix and uterus: Secondary | ICD-10-CM | POA: Insufficient documentation

## 2017-08-12 DIAGNOSIS — C5702 Malignant neoplasm of left fallopian tube: Secondary | ICD-10-CM | POA: Diagnosis not present

## 2017-08-12 DIAGNOSIS — Z9221 Personal history of antineoplastic chemotherapy: Secondary | ICD-10-CM | POA: Diagnosis not present

## 2017-08-12 NOTE — Patient Instructions (Signed)
Please notify Dr Skeet Latch at phone number [phone] if you notice vaginal bleeding, new pelvic or abdominal pains, bloating, feeling full easy, or a change in bladder or bowel function.   Please follow-up with Dr Skeet Latch in May after you return from your trip.  Dr Leone Brand office will contact you with your lab result from today.

## 2017-08-12 NOTE — Progress Notes (Signed)
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   ASSESSMENT/PLAN: Has completed 6 cycles of Taxol paclitaxel therapy, s/p carb/tax adjuvant therapy completed July, 2018. Marland Kitchen  At this time is doing well with complete clinical response.  Follow-up in 3 months (or May, 2019 when [place]).  CA 125 today.  Counseled regarding signs and symptoms of recurrent disease  HISTORY OF PRESENT ILLNESS  Oncology History   Laura Washington is a 52 y.o. woman who is seen in consultation at the request of Dr. Talbert Nan for evaluation of a new adnexal mass. She initially presented to her primary provider with complaint of vaginal discharge was noted to have an enlarged uterus vs pelvic mass. This prompted an ultrasound which revealed an adnexal mass and free fluid   HRD testing positive       Cancer of left fallopian tube ([place])   07/28/2016 Initial Diagnosis    She presented to PCP complaining of heavy menstruation for almost 1 year      08/27/2016 Imaging    She was referred to see Dr. Talbert Nan after she started to have vaginal discharge. Pelvic US showed: 7.4 x 6.4 x 6.6 cm solid mass in the left adnexa with irregular borders and blood flow. The Pulsatile index is <1 and the resistive index is <0.4. There is a large amount of pelvic fluid. Concerning for possible ovarian cancer.        08/27/2016 Tumor Marker    Patient's tumor was tested for the following markers: CA125. Results of the tumor marker test revealed 1577      09/09/2016 Surgery    Procedures: Bilateral - Robotic Laparoscopy, Surgical; With Bilateral Total Pelvic Lymphadenectomy Peri-Aortic Lymph Node Sample, Sgl/Mult Robotic Bilateral Salpingo-Oophorectomy W/Omentectomy, Total Abd Hysterectomy & Radical Dissection For Debulking;    Findings:  1. 9-10 cm left adnexal mass with rupture of tumor during manipulation 2. Dilated left fallopian tube removed in tact with IOFS:  poorly differentiated carcinoma 3. Endometriosis implants on anterior and posterior cul-de-sac 4. Grossly normal omentum, diaphragm, small bowel mesenter 5. Grossly normal uterus and right fallopian tube/ovary 6. IOFS: anterior peritoneal biopsy (endometriosis) // left fallopian tube: poorly differentiated carcinoma 7. 1-2 cm tumor nodule in posterior cul-de-sac; completely resected 8. Non-enlarged lymph nodes 9. R0 resection at end of case 10. Ascites present      09/09/2016 Pathology Results    Pelvic fluid, washing: No definitive malignant cells identified. Blood, histiocytes, and reactive mesothelial cells present. Mixed inflammation present. See comment.      09/09/2016 Cancer Staging    Cancer Staging Cancer of left fallopian tube [facility]) Staging form: Ovary, Fallopian Tube, and Primary Peritoneal Carcinoma, AJCC 8th Edition - Pathologic stage from 09/23/2016: FIGO Stage IIA (pT2a, pN0, cM0) - Signed by Heath Lark, MD on 12/02/2016       11/30/2016 Imaging    1. Nonspecific trace fluid in the pelvic cul-de-sac. No discrete mass, adenopathy or other specific finding of local tumor recurrence in the pelvis. 2. No evidence of metastatic disease in the chest or abdomen.      12/01/2016 Procedure    Successful placement of a right IJ approach Power Port with ultrasound and fluoroscopic guidance. The catheter is ready for use      12/02/2016 Tumor Marker    Patient's tumor was tested for the following markers: CA125 Results of the tumor marker test revealed 7.8      12/04/2016 -  Chemotherapy    She received carboplatin and Taxol      12/21/2016 Genetic Testing    CDKN2A (p16INK4a) c.413G>A (p.Arg138Lys) and MLH1 c.290A>G (p.Tyr97Cys) VUS found on genetic testing on the [facility] panel.  The [facility] gene panel offered by [employer] includes sequencing and deletion/duplication testing of the following 28 genes: APC, ATM, BARD1, BMPR1A, BRCA1, BRCA2, BRIP1, CHD1,  CDK4, CDKN2A, CHEK2, EPCAM (large rearrangement only), MLH1, MSH2, MSH6, MUTYH, NBN, PALB2, PMS2, PTEN, RAD51C, RAD51D, SMAD4, STK11, and TP53. Sequencing was performed for select regions of POLE and POLD1, and large rearrangement analysis was performed for select regions of GREM1. The report date is December 11, 2016.  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  HRD can be indicated by the presence of a tumor BRCA1 or BRCA2 mutation and/or genomic instability. HRD testing found genomic instability.  The report date is December 21, 2016.        01/18/2017 Adverse Reaction    Dose of Taxol is reduced due to elevated liver enzymes      05/13/2017 Imaging    CT abd/pelvis: No acute abdominal pelvic findings, masses or adenopathy. No evidence of pelvic tumor recurrence or metastatic disease.       Past Medical History:  Diagnosis Date  . Family history of breast cancer   . Migraines   . Ovarian cancer [facility])    Past Surgical History:  Procedure Laterality Date  . IR FLUORO GUIDE PORT INSERTION RIGHT  12/01/2016  . IR REMOVAL TUN ACCESS W/ PORT W/O FL MOD SED  05/19/2017  . IR US GUIDE VASC ACCESS RIGHT  12/01/2016  . TOTAL ABDOMINAL HYSTERECTOMY W/ BILATERAL SALPINGOOPHORECTOMY     ROS:  Mild numbness of the fingers, no nausea or emesis, reports weight gain, no vaginal or rectal bleeding.    Alopecia of the scalp brows and eyelids. Otherwise ROS is non contributory.  PHYSICAL EXAMINATION: BP (!) 142/78 (BP Location: Left Arm, Patient Position: Sitting)   Pulse 100   Temp (!) 97.4 F (36.3 C) (Oral)   Resp (!) 101   Ht '5\' 3"'  (1.6 m)   Wt 149 lb 1.6 oz (67.6 kg)   LMP 08/09/2016   SpO2 100%   BMI 26.41 kg/m  Wt Readings from Last 3 Encounters:  08/12/17 149 lb 1.6 oz (67.6 kg)  04/06/17 137 lb 4.8 oz (62.3 kg)  03/12/17 134 lb 8 oz (61 kg)  CHEST:  CTA CV: RRR  ABD:  Soft NT, Port sites without masses,   Pelvic exam: normal external female genitalia, normal vagina and normal support.  Vaginal cuff smooth and free of lesions or masses. Rectal: no masses or nodularity appreciated.

## 2017-08-13 LAB — CA 125: CANCER ANTIGEN (CA) 125: 7.9 U/mL (ref 0.0–38.1)

## 2017-08-16 ENCOUNTER — Telehealth: Payer: Self-pay

## 2017-08-16 NOTE — Telephone Encounter (Signed)
Told Ms Laura Washington that her CA-125 was WNL at 7.9 on 08-12-17 per Joylene John, NP. Pt verbalized  understanding.

## 2017-11-11 ENCOUNTER — Encounter: Payer: Self-pay | Admitting: Genetic Counselor

## 2017-11-25 ENCOUNTER — Inpatient Hospital Stay: Payer: BLUE CROSS/BLUE SHIELD | Attending: Gynecologic Oncology | Admitting: Gynecologic Oncology

## 2017-11-25 VITALS — BP 128/87 | HR 96 | Temp 98.0°F | Resp 20 | Ht 63.0 in | Wt 150.0 lb

## 2017-11-25 DIAGNOSIS — C5702 Malignant neoplasm of left fallopian tube: Secondary | ICD-10-CM

## 2017-11-25 DIAGNOSIS — Z8542 Personal history of malignant neoplasm of other parts of uterus: Secondary | ICD-10-CM | POA: Diagnosis not present

## 2017-11-25 NOTE — Progress Notes (Signed)
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   ASSESSMENT/PLAN: Has completed 6 cycles of Taxol paclitaxel therapy, s/p carb/tax adjuvant therapy completed July, 2018. Marland Kitchen  At this time is doing well with complete clinical response.  Follow-up in 3 months  (HRD positive -candidate for PARPi) Counseled regarding signs and symptoms of recurrent disease  HISTORY OF PRESENT ILLNESS  Oncology History   Laura Washington is a 52 y.o. woman who is seen in consultation at the request of Dr. Talbert Nan for evaluation of a new adnexal mass. She initially presented to her primary provider with complaint of vaginal discharge was noted to have an enlarged uterus vs pelvic mass. This prompted an ultrasound which revealed an adnexal mass and free fluid   HRD testing positive       Cancer of left fallopian tube ([place])   07/28/2016 Initial Diagnosis    She presented to PCP complaining of heavy menstruation for almost 1 year      08/27/2016 Imaging    She was referred to see Dr. Talbert Nan after she started to have vaginal discharge. Pelvic US showed: 7.4 x 6.4 x 6.6 cm solid mass in the left adnexa with irregular borders and blood flow. The Pulsatile index is <1 and the resistive index is <0.4. There is a large amount of pelvic fluid. Concerning for possible ovarian cancer.        08/27/2016 Tumor Marker    Patient's tumor was tested for the following markers: CA125. Results of the tumor marker test revealed 1577      09/09/2016 Surgery    Procedures: Bilateral - Robotic Laparoscopy, Surgical; With Bilateral Total Pelvic Lymphadenectomy Peri-Aortic Lymph Node Sample, Sgl/Mult Robotic Bilateral Salpingo-Oophorectomy W/Omentectomy, Total Abd Hysterectomy & Radical Dissection For Debulking;    Findings:  1. 9-10 cm left adnexal mass with rupture of tumor during manipulation 2. Dilated left fallopian tube removed in tact with IOFS: poorly differentiated  carcinoma 3. Endometriosis implants on anterior and posterior cul-de-sac 4. Grossly normal omentum, diaphragm, small bowel mesenter 5. Grossly normal uterus and right fallopian tube/ovary 6. IOFS: anterior peritoneal biopsy (endometriosis) // left fallopian tube: poorly differentiated carcinoma 7. 1-2 cm tumor nodule in posterior cul-de-sac; completely resected 8. Non-enlarged lymph nodes 9. R0 resection at end of case 10. Ascites present      09/09/2016 Pathology Results    Pelvic fluid, washing: No definitive malignant cells identified. Blood, histiocytes, and reactive mesothelial cells present. Mixed inflammation present. See comment.      09/09/2016 Cancer Staging    Cancer Staging Cancer of left fallopian tube [facility]) Staging form: Ovary, Fallopian Tube, and Primary Peritoneal Carcinoma, AJCC 8th Edition - Pathologic stage from 09/23/2016: FIGO Stage IIA (pT2a, pN0, cM0) - Signed by Heath Lark, MD on 12/02/2016       11/30/2016 Imaging    1. Nonspecific trace fluid in the pelvic cul-de-sac. No discrete mass, adenopathy or other specific finding of local tumor recurrence in the pelvis. 2. No evidence of metastatic disease in the chest or abdomen.      12/01/2016 Procedure    Successful placement of a right IJ approach Power Port with ultrasound and fluoroscopic guidance. The catheter is ready for use      12/02/2016 Tumor Marker    Patient's tumor was tested for the following markers: CA125 Results of the tumor marker test revealed 7.8      12/04/2016 - 04/06/2017 Chemotherapy    She received carboplatin and  Taxol      12/21/2016 Genetic Testing    CDKN2A (p16INK4a) c.413G>A (p.Arg138Lys) and MLH1 c.290A>G (p.Tyr97Cys) VUS found on genetic testing on the [facility] panel.  The [facility] gene panel offered by [employer] includes sequencing and deletion/duplication testing of the following 28 genes: APC, ATM, BARD1, BMPR1A, BRCA1, BRCA2, BRIP1, CHD1, CDK4, CDKN2A,  CHEK2, EPCAM (large rearrangement only), MLH1, MSH2, MSH6, MUTYH, NBN, PALB2, PMS2, PTEN, RAD51C, RAD51D, SMAD4, STK11, and TP53. Sequencing was performed for select regions of POLE and POLD1, and large rearrangement analysis was performed for select regions of GREM1. The report date is December 11, 2016.  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  HRD can be indicated by the presence of a tumor BRCA1 or BRCA2 mutation and/or genomic instability. HRD testing found genomic instability.  The report date is December 21, 2016.        01/18/2017 Adverse Reaction    Dose of Taxol is reduced due to elevated liver enzymes      05/13/2017 Imaging    CT abd/pelvis: No acute abdominal pelvic findings, masses or adenopathy. No evidence of pelvic tumor recurrence or metastatic disease.       08/12/2017 Miscellaneous    CA125 7.9      Past Medical History:  Diagnosis Date  . Family history of breast cancer   . Migraines   . Ovarian cancer [facility])    Past Surgical History:  Procedure Laterality Date  . IR FLUORO GUIDE PORT INSERTION RIGHT  12/01/2016  . IR REMOVAL TUN ACCESS W/ PORT W/O FL MOD SED  05/19/2017  . IR US GUIDE VASC ACCESS RIGHT  12/01/2016  . TOTAL ABDOMINAL HYSTERECTOMY W/ BILATERAL SALPINGOOPHORECTOMY     ROS:  Mild numbness of the fingers, no nausea or emesis, reports weight gain, no bloating no early satiety, no vaginal or rectal bleeding.    Alopecia of the scalp brows and eyelids has resolved. Otherwise ROS is non contributory.  PHYSICAL EXAMINATION: BP 128/87 (BP Location: Right Arm, Patient Position: Sitting)   Pulse 96   Temp 98 F (36.7 C) (Oral)   Resp 20   Ht '5\' 3"'  (1.6 m)   Wt 150 lb (68 kg)   LMP 08/09/2016   SpO2 99%   BMI 26.57 kg/m  Wt Readings from Last 3 Encounters:  11/25/17 150 lb (68 kg)  08/12/17 149 lb 1.6 oz (67.6 kg)  04/06/17 137 lb 4.8 oz (62.3 kg)  CHEST:  CTA CV: RRR  ABD:  Soft NT, Port sites without masses,  Back: No CVA tenderness Pelvic exam:  normal external female genitalia, normal vagina and normal support. Vaginal cuff smooth and free of lesions or masses. Rectal: no masses or nodularity appreciated. Extremities: No clubbing cyanosis or edema.  No nails on the lower extremity digits

## 2017-11-25 NOTE — Patient Instructions (Signed)
Plan to follow up in three months or sooner if needed.  Please call for any needs.

## 2018-03-10 ENCOUNTER — Encounter: Payer: Self-pay | Admitting: Gynecologic Oncology

## 2018-03-10 ENCOUNTER — Inpatient Hospital Stay: Payer: BLUE CROSS/BLUE SHIELD | Attending: Gynecologic Oncology | Admitting: Gynecologic Oncology

## 2018-03-10 VITALS — BP 110/78 | HR 86 | Temp 98.5°F | Resp 18 | Ht 63.0 in | Wt 158.1 lb

## 2018-03-10 DIAGNOSIS — M5432 Sciatica, left side: Secondary | ICD-10-CM | POA: Diagnosis not present

## 2018-03-10 DIAGNOSIS — M5430 Sciatica, unspecified side: Secondary | ICD-10-CM

## 2018-03-10 DIAGNOSIS — Z9221 Personal history of antineoplastic chemotherapy: Secondary | ICD-10-CM | POA: Diagnosis not present

## 2018-03-10 DIAGNOSIS — Z90722 Acquired absence of ovaries, bilateral: Secondary | ICD-10-CM | POA: Insufficient documentation

## 2018-03-10 DIAGNOSIS — Z90710 Acquired absence of both cervix and uterus: Secondary | ICD-10-CM | POA: Insufficient documentation

## 2018-03-10 DIAGNOSIS — C5702 Malignant neoplasm of left fallopian tube: Secondary | ICD-10-CM | POA: Diagnosis not present

## 2018-03-10 NOTE — Progress Notes (Signed)
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   ASSESSMENT/PLAN: Has completed 6 cycles of Taxol paclitaxel therapy, s/p carb/tax adjuvant therapy completed July, 2018.  At this time is doing well with complete clinical response.  Follow-up in 3 months  (HRD positive -candidate for PARPi) Counseled regarding signs and symptoms of recurrent disease  Left-sided sciatica Formational material provided.  Counseled regarding use of nonsteroidals compresses to the back and rest The patient will discuss this with her primary care doctor Follow-up in 3 months.  If no improvement will consider imaging  HISTORY OF PRESENT ILLNESS  Oncology History   Laura Washington is a 52 y.o. woman who is seen in consultation at the request of Dr. Talbert Nan for evaluation of a new adnexal mass. She initially presented to her primary provider with complaint of vaginal discharge was noted to have an enlarged uterus vs pelvic mass. This prompted an ultrasound which revealed an adnexal mass and free fluid   HRD testing positive Gene panel analysis with Myriad myrisk was negative      Cancer of left fallopian tube ([place])   07/28/2016 Initial Diagnosis    She presented to PCP complaining of heavy menstruation for almost 1 year    08/27/2016 Imaging    She was referred to see Dr. Talbert Nan after she started to have vaginal discharge. Pelvic US showed: 7.4 x 6.4 x 6.6 cm solid mass in the left adnexa with irregular borders and blood flow. The Pulsatile index is <1 and the resistive index is <0.4. There is a large amount of pelvic fluid. Concerning for possible ovarian cancer.      08/27/2016 Tumor Marker    Patient's tumor was tested for the following markers: CA125. Results of the tumor marker test revealed 1577    09/09/2016 Surgery    Procedures: Bilateral - Robotic Laparoscopy, Surgical; With Bilateral Total Pelvic Lymphadenectomy Peri-Aortic Lymph Node Sample,  Sgl/Mult Robotic Bilateral Salpingo-Oophorectomy W/Omentectomy, Total Abd Hysterectomy & Radical Dissection For Debulking;    Findings:  1. 9-10 cm left adnexal mass with rupture of tumor during manipulation 2. Dilated left fallopian tube removed in tact with IOFS: poorly differentiated carcinoma 3. Endometriosis implants on anterior and posterior cul-de-sac 4. Grossly normal omentum, diaphragm, small bowel mesenter 5. Grossly normal uterus and right fallopian tube/ovary 6. IOFS: anterior peritoneal biopsy (endometriosis) // left fallopian tube: poorly differentiated carcinoma 7. 1-2 cm tumor nodule in posterior cul-de-sac; completely resected 8. Non-enlarged lymph nodes 9. R0 resection at end of case 10. Ascites present    09/09/2016 Pathology Results    Pelvic fluid, washing: No definitive malignant cells identified. Blood, histiocytes, and reactive mesothelial cells present. Mixed inflammation present. See comment.    09/09/2016 Cancer Staging    Cancer Staging Cancer of left fallopian tube [facility]) Staging form: Ovary, Fallopian Tube, and Primary Peritoneal Carcinoma, AJCC 8th Edition - Pathologic stage from 09/23/2016: FIGO Stage IIA (pT2a, pN0, cM0) - Signed by Heath Lark, MD on 12/02/2016     11/30/2016 Imaging    1. Nonspecific trace fluid in the pelvic cul-de-sac. No discrete mass, adenopathy or other specific finding of local tumor recurrence in the pelvis. 2. No evidence of metastatic disease in the chest or abdomen.    12/01/2016 Procedure    Successful placement of a right IJ approach Power Port with ultrasound and fluoroscopic guidance. The catheter is ready for use    12/02/2016 Tumor Marker    Patient's tumor was tested  for the following markers: CA125 Results of the tumor marker test revealed 7.8    12/04/2016 - 04/06/2017 Chemotherapy    She received carboplatin and Taxol    12/21/2016 Genetic Testing    CDKN2A (p16INK4a) c.413G>A (p.Arg138Lys) and MLH1 c.290A>G  (p.Tyr97Cys) VUS found on genetic testing on the [facility] panel.  The [facility] gene panel offered by [employer] includes sequencing and deletion/duplication testing of the following 28 genes: APC, ATM, BARD1, BMPR1A, BRCA1, BRCA2, BRIP1, CHD1, CDK4, CDKN2A, CHEK2, EPCAM (large rearrangement only), MLH1, MSH2, MSH6, MUTYH, NBN, PALB2, PMS2, PTEN, RAD51C, RAD51D, SMAD4, STK11, and TP53. Sequencing was performed for select regions of POLE and POLD1, and large rearrangement analysis was performed for select regions of GREM1. The report date is December 11, 2016.  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  HRD can be indicated by the presence of a tumor BRCA1 or BRCA2 mutation and/or genomic instability. HRD testing found genomic instability.  The report date is December 21, 2016.      01/18/2017 Adverse Reaction    Dose of Taxol is reduced due to elevated liver enzymes    05/13/2017 Imaging    CT abd/pelvis: No acute abdominal pelvic findings, masses or adenopathy. No evidence of pelvic tumor recurrence or metastatic disease.     08/12/2017 Miscellaneous    CA125 7.9   Interval history; reports left low back pain with radiation of discomfort along the lateral aspect of the left leg to the knee for the past 2 months.  Denies heavy lifting  Past Medical History:  Diagnosis Date  . Family history of breast cancer   . Migraines   . Ovarian cancer [facility])    Past Surgical History:  Procedure Laterality Date  . IR FLUORO GUIDE PORT INSERTION RIGHT  12/01/2016  . IR REMOVAL TUN ACCESS W/ PORT W/O FL MOD SED  05/19/2017  . IR US GUIDE VASC ACCESS RIGHT  12/01/2016  . TOTAL ABDOMINAL HYSTERECTOMY W/ BILATERAL SALPINGOOPHORECTOMY     ROS:  Mild numbness of the fingers, no nausea or emesis, reports weight gain, no bloating no early satiety, no vaginal or rectal bleeding.  Left low back pain with radiation down the left lateral aspect of the thigh to the knee otherwise ROS is non  contributory.  PHYSICAL EXAMINATION: BP 110/78 (BP Location: Right Arm) Comment: manual bp  Pulse 86   Temp 98.5 F (36.9 C) (Oral)   Resp 18   Ht '5\' 3"'  (1.6 m)   Wt 158 lb 1.6 oz (71.7 kg)   LMP 08/09/2016   SpO2 100%   BMI 28.01 kg/m  Wt Readings from Last 3 Encounters:  03/10/18 158 lb 1.6 oz (71.7 kg)  11/25/17 150 lb (68 kg)  08/12/17 149 lb 1.6 oz (67.6 kg)  CHEST:  CTA CV: RRR  ABD:  Soft NT, Port sites without masses,  Back: No CVA tenderness Pelvic exam: normal external female genitalia, normal vagina and normal support. Vagina without  Masses.No cul de sac nodularity. Rectal: no masses or nodularity appreciated. Extremities: No clubbing cyanosis or edema.  No nails on the lower extremity digits

## 2018-03-10 NOTE — Patient Instructions (Signed)
Sciatica Sciatica is pain, numbness, weakness, or tingling along your sciatic nerve. The sciatic nerve starts in the lower back and goes down the back of each leg. Sciatica happens when this nerve is pinched or has pressure put on it. Sciatica usually goes away on its own or with treatment. Sometimes, sciatica may keep coming back (recur). Follow these instructions at home: Medicines  Take over-the-counter and prescription medicines only as told by your doctor.  Do not drive or use heavy machinery while taking prescription pain medicine. Managing pain  If directed, put ice on the affected area. ? Put ice in a plastic bag. ? Place a towel between your skin and the bag. ? Leave the ice on for 20 minutes, 2-3 times a day.  After icing, apply heat to the affected area before you exercise or as often as told by your doctor. Use the heat source that your doctor tells you to use, such as a moist heat pack or a heating pad. ? Place a towel between your skin and the heat source. ? Leave the heat on for 20-30 minutes. ? Remove the heat if your skin turns bright red. This is especially important if you are unable to feel pain, heat, or cold. You may have a greater risk of getting burned. Activity  Return to your normal activities as told by your doctor. Ask your doctor what activities are safe for you. ? Avoid activities that make your sciatica worse.  Take short rests during the day. Rest in a lying or standing position. This is usually better than sitting to rest. ? When you rest for a long time, do some physical activity or stretching between periods of rest. ? Avoid sitting for a long time without moving. Get up and move around at least one time each hour.  Exercise and stretch regularly, as told by your doctor.  Do not lift anything that is heavier than 10 lb (4.5 kg) while you have symptoms of sciatica. ? Avoid lifting heavy things even when you do not have symptoms. ? Avoid lifting heavy  things over and over.  When you lift objects, always lift in a way that is safe for your body. To do this, you should: ? Bend your knees. ? Keep the object close to your body. ? Avoid twisting. General instructions  Use good posture. ? Avoid leaning forward when you are sitting. ? Avoid hunching over when you are standing.  Stay at a healthy weight.  Wear comfortable shoes that support your feet. Avoid wearing high heels.  Avoid sleeping on a mattress that is too soft or too hard. You might have less pain if you sleep on a mattress that is firm enough to support your back.  Keep all follow-up visits as told by your doctor. This is important. Contact a doctor if:  You have pain that: ? Wakes you up when you are sleeping. ? Gets worse when you lie down. ? Is worse than the pain you have had in the past. ? Lasts longer than 4 weeks.  You lose weight for without trying. Get help right away if:  You cannot control when you pee (urinate) or poop (have a bowel movement).  You have weakness in any of these areas and it gets worse. ? Lower back. ? Lower belly (pelvis). ? Butt (buttocks). ? Legs.  You have redness or swelling of your back.  You have a burning feeling when you pee. This information is not intended to replace   advice given to you by your health care provider. Make sure you discuss any questions you have with your health care provider. Document Released: 04/07/2008 Document Revised: 12/05/2015 Document Reviewed: 03/08/2015 Elsevier Interactive Patient Education  [employer].   Follow-up in December 2019

## 2018-05-01 IMAGING — CT CT CHEST W/ CM
2 of 5 series · 13 of 36 positions shown, 16 images · IV contrast (APPLIED)
Comparison: Outside pelvic sonogram from 08/27/2016.

CLINICAL DATA: FIGO IIA left fallopian tube cancer diagnosed in
August 2016 status post TAHBSO, omentectomy and radical
dissection, presenting for restaging prior to adjuvant chemotherapy.

EXAM:
CT CHEST, ABDOMEN, AND PELVIS WITH CONTRAST
TECHNIQUE: Multidetector CT imaging of the chest, abdomen and pelvis was
performed following the standard protocol during bolus
administration of intravenous contrast.
CONTRAST:  100mL FL5QCC-744 IOPAMIDOL (FL5QCC-744) INJECTION 61%

[Series 2: cap with · axial · 0.69mm/px · z∈[+981,+1471]mm · 10 of 120 slices shown, 13 images]
[im 11/120  mediastinal]
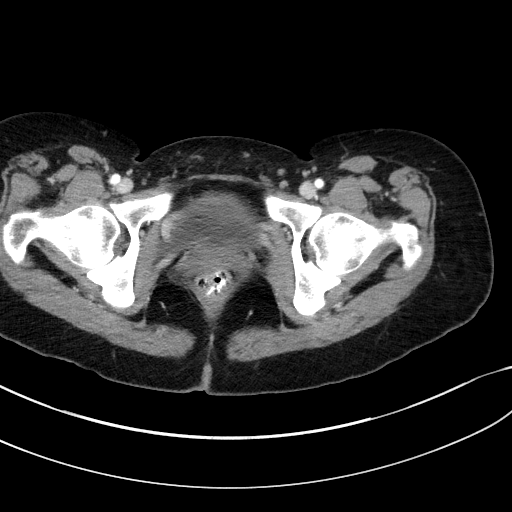
[im 11/120  lung]
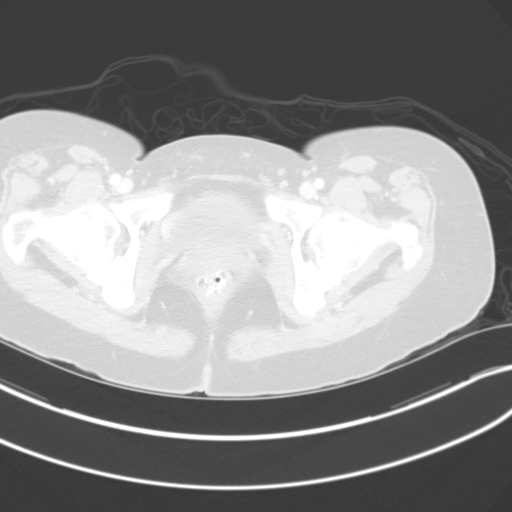
[im 22/120  lung]
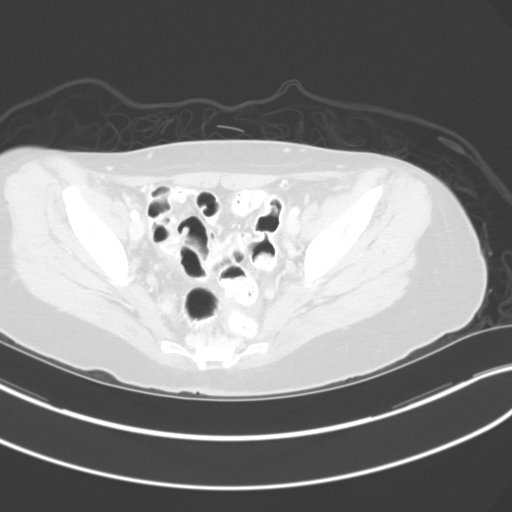
[im 33/120  lung]
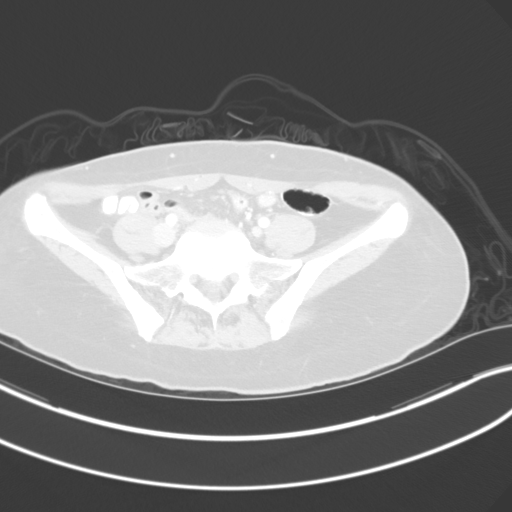
[im 44/120  lung]
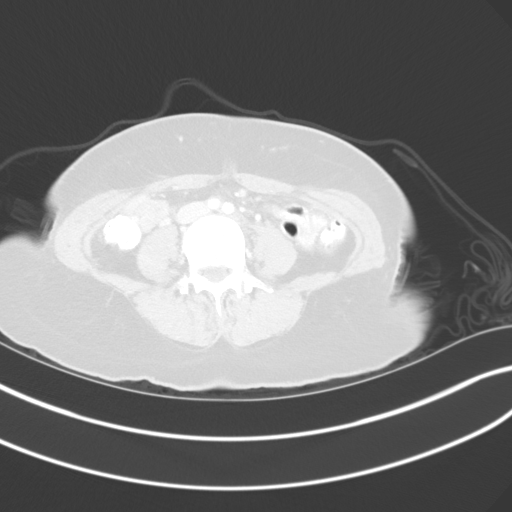
[im 55/120  mediastinal]
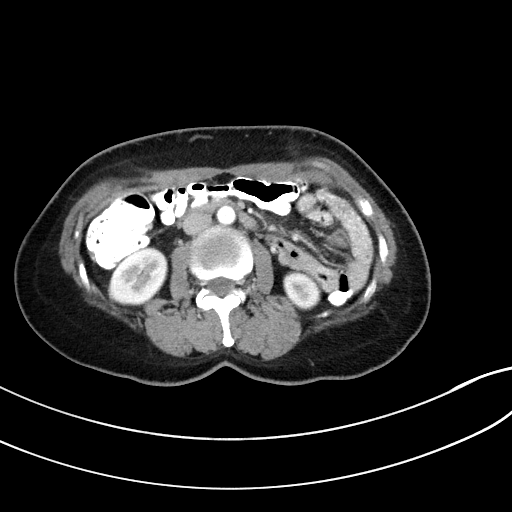
[im 55/120  lung]
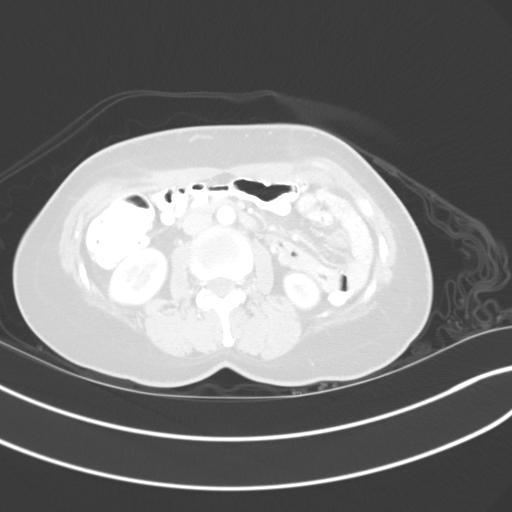
[im 65/120  lung]
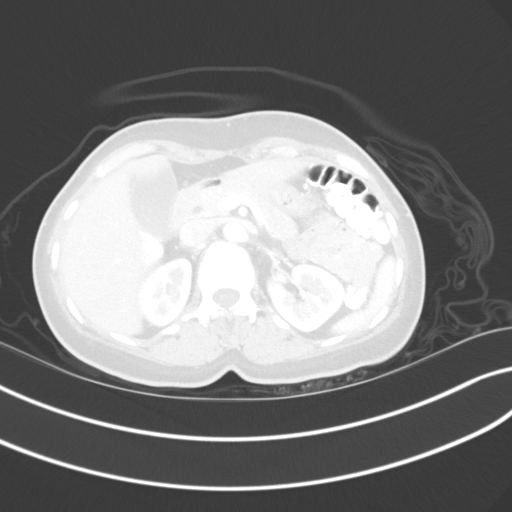
[im 76/120  lung]
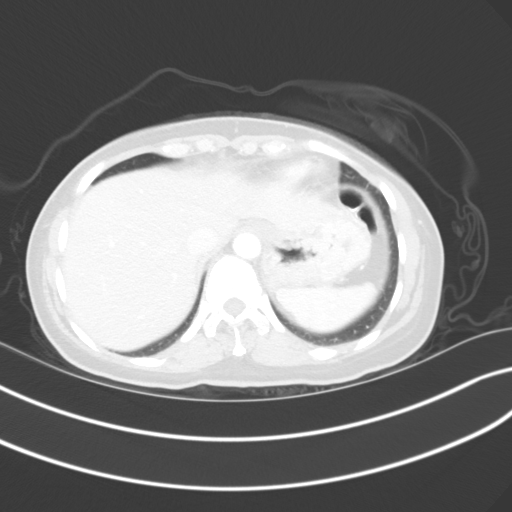
[im 87/120  lung]
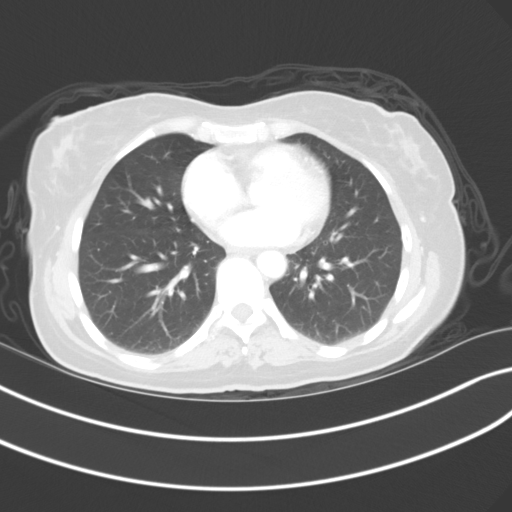
[im 98/120  mediastinal]
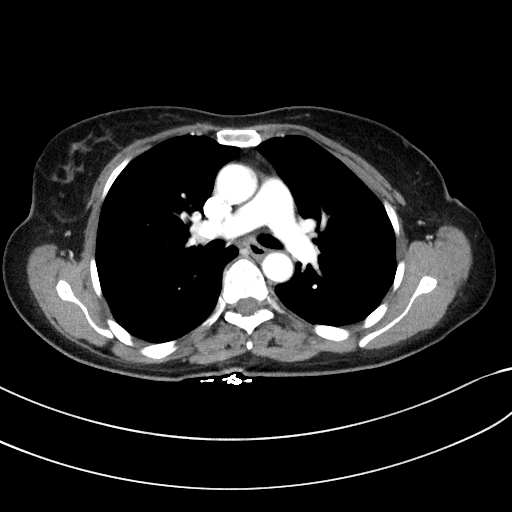
[im 98/120  lung]
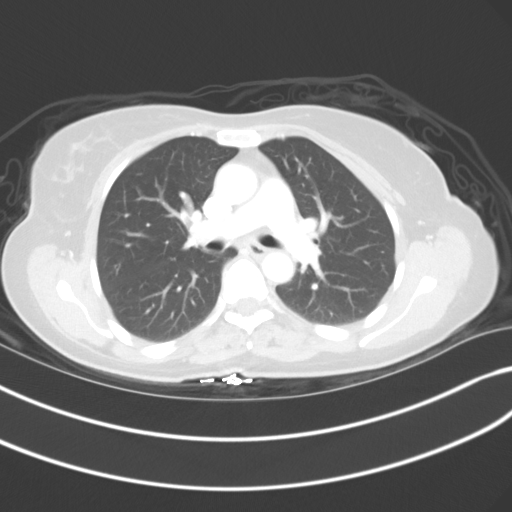
[im 109/120  lung]
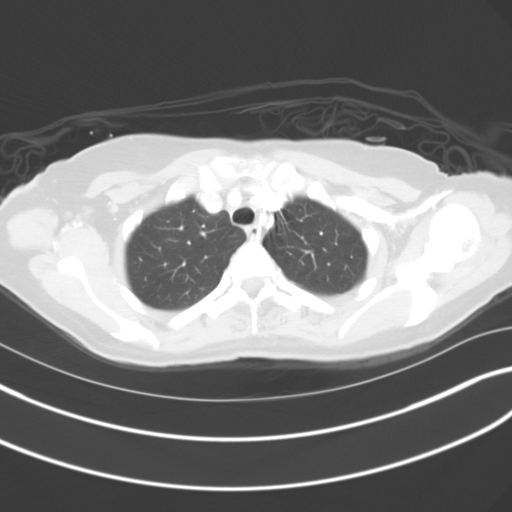

[Series 5: coronals · coronal · 0.82mm/px · 3 of 108 slices shown]
[im 22/108  lung]
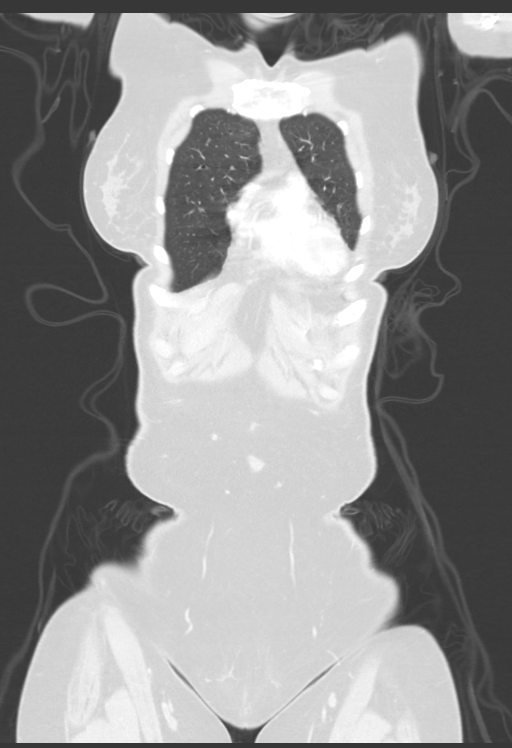
[im 43/108  lung]
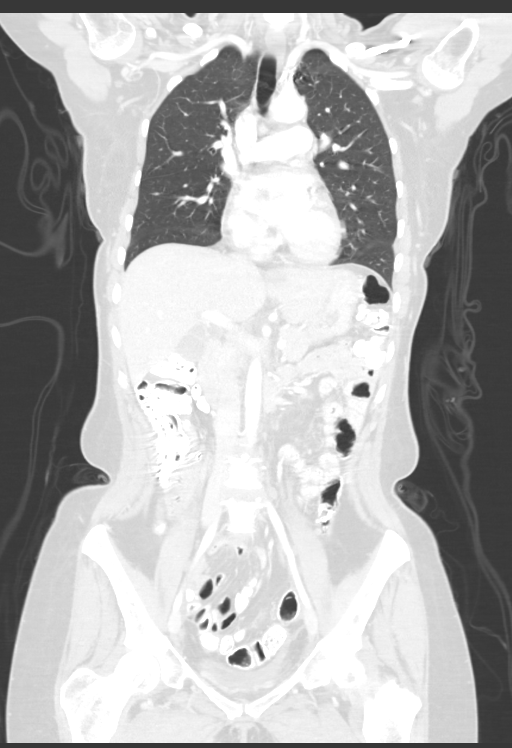
[im 65/108  lung]
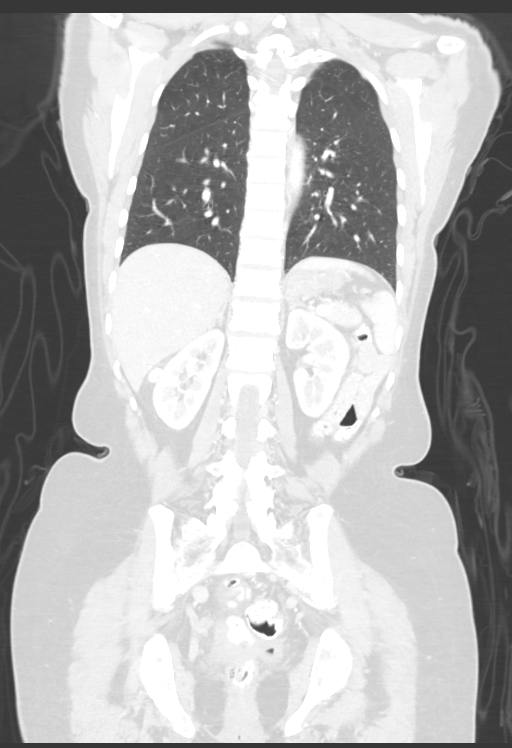

[13 of 36 positions shown; findings below may reference images not displayed]

FINDINGS: CT CHEST FINDINGS

Cardiovascular: Normal heart size. No significant pericardial
fluid/thickening. Great vessels are normal in course and caliber. No
central pulmonary emboli.

Mediastinum/Nodes: No discrete thyroid nodules. Unremarkable
esophagus. No pathologically enlarged axillary, mediastinal or hilar
lymph nodes.

Lungs/Pleura: No pneumothorax. No pleural effusion. No acute
consolidative airspace disease, lung masses or significant pulmonary
nodules.

Musculoskeletal: No aggressive appearing focal osseous lesions.
Minimal thoracic spondylosis.

CT ABDOMEN PELVIS FINDINGS

Hepatobiliary: Normal liver with no liver mass. Normal gallbladder
with no radiopaque cholelithiasis. No biliary ductal dilatation.

Pancreas: Normal, with no mass or duct dilation.

Spleen: Normal size. No mass.

Adrenals/Urinary Tract: Normal adrenals. Normal kidneys with no
hydronephrosis and no renal mass. Relatively collapsed and grossly
normal bladder.

Stomach/Bowel: Grossly normal stomach. Normal caliber small bowel
with no small bowel wall thickening. Candidate diminutive normal
appendix with no pericecal inflammatory changes. Normal large bowel
with no diverticulosis, large bowel wall thickening or pericolonic
fat stranding.

Vascular/Lymphatic: Normal caliber abdominal aorta. Patent portal,
splenic, hepatic and renal veins. No pathologically enlarged lymph
nodes in the abdomen or pelvis.

Reproductive: Status post hysterectomy, with no abnormal findings at
the vaginal cuff. No adnexal mass.

Other: No pneumoperitoneum. No focal fluid collection. Nonspecific
trace fluid in the pelvic cul-de-sac. Subcentimeter soft tissue
nodule anterior to the spleen is favored to represent a tiny
splenule (series 2/image 47).

Musculoskeletal: No aggressive appearing focal osseous lesions. Mild
lumbar spondylosis.
IMPRESSION: 1. Nonspecific trace fluid in the pelvic cul-de-sac. No discrete
mass, adenopathy or other specific finding of local tumor recurrence
in the pelvis.
2. No evidence of metastatic disease in the chest or abdomen.

## 2018-06-29 NOTE — Progress Notes (Deleted)
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   ASSESSMENT/PLAN: Has completed 6 cycles of Taxol paclitaxel therapy, s/p carb/tax adjuvant therapy completed July, 2018.  At this time is doing well with complete clinical response.  Follow-up in 3 months  (HRD positive -candidate for PARPi) Counseled regarding signs and symptoms of recurrent disease  Left-sided sciatica Formational material provided.  Counseled regarding use of nonsteroidals compresses to the back and rest The patient will discuss this with her primary care doctor Follow-up in 3 months.  If no improvement will consider imaging  HISTORY OF PRESENT ILLNESS  Oncology History   Laura Washington is a 52 y.o. woman who is seen in consultation at the request of Dr. Talbert Nan for evaluation of a new adnexal mass. She initially presented to her primary provider with complaint of vaginal discharge was noted to have an enlarged uterus vs pelvic mass. This prompted an ultrasound which revealed an adnexal mass and free fluid   HRD testing positive Gene panel analysis with Myriad myrisk was negative      Cancer of left fallopian tube ([place])   07/28/2016 Initial Diagnosis    She presented to PCP complaining of heavy menstruation for almost 1 year    08/27/2016 Imaging    She was referred to see Dr. Talbert Nan after she started to have vaginal discharge. Pelvic US showed: 7.4 x 6.4 x 6.6 cm solid mass in the left adnexa with irregular borders and blood flow. The Pulsatile index is <1 and the resistive index is <0.4. There is a large amount of pelvic fluid. Concerning for possible ovarian cancer.      08/27/2016 Tumor Marker    Patient's tumor was tested for the following markers: CA125. Results of the tumor marker test revealed 1577    09/09/2016 Surgery    Procedures: Bilateral - Robotic Laparoscopy, Surgical; With Bilateral Total Pelvic Lymphadenectomy Peri-Aortic Lymph Node Sample,  Sgl/Mult Robotic Bilateral Salpingo-Oophorectomy W/Omentectomy, Total Abd Hysterectomy & Radical Dissection For Debulking;    Findings:  1. 9-10 cm left adnexal mass with rupture of tumor during manipulation 2. Dilated left fallopian tube removed in tact with IOFS: poorly differentiated carcinoma 3. Endometriosis implants on anterior and posterior cul-de-sac 4. Grossly normal omentum, diaphragm, small bowel mesenter 5. Grossly normal uterus and right fallopian tube/ovary 6. IOFS: anterior peritoneal biopsy (endometriosis) // left fallopian tube: poorly differentiated carcinoma 7. 1-2 cm tumor nodule in posterior cul-de-sac; completely resected 8. Non-enlarged lymph nodes 9. R0 resection at end of case 10. Ascites present    09/09/2016 Pathology Results    Pelvic fluid, washing: No definitive malignant cells identified. Blood, histiocytes, and reactive mesothelial cells present. Mixed inflammation present. See comment.    09/09/2016 Cancer Staging    Cancer Staging Cancer of left fallopian tube [facility]) Staging form: Ovary, Fallopian Tube, and Primary Peritoneal Carcinoma, AJCC 8th Edition - Pathologic stage from 09/23/2016: FIGO Stage IIA (pT2a, pN0, cM0) - Signed by Heath Lark, MD on 12/02/2016     11/30/2016 Imaging    1. Nonspecific trace fluid in the pelvic cul-de-sac. No discrete mass, adenopathy or other specific finding of local tumor recurrence in the pelvis. 2. No evidence of metastatic disease in the chest or abdomen.    12/01/2016 Procedure    Successful placement of a right IJ approach Power Port with ultrasound and fluoroscopic guidance. The catheter is ready for use    12/02/2016 Tumor Marker    Patient's tumor was tested  for the following markers: CA125 Results of the tumor marker test revealed 7.8    12/04/2016 - 04/06/2017 Chemotherapy    She received carboplatin and Taxol    12/21/2016 Genetic Testing    CDKN2A (p16INK4a) c.413G>A (p.Arg138Lys) and MLH1 c.290A>G  (p.Tyr97Cys) VUS found on genetic testing on the [facility] panel.  The [facility] gene panel offered by [employer] includes sequencing and deletion/duplication testing of the following 28 genes: APC, ATM, BARD1, BMPR1A, BRCA1, BRCA2, BRIP1, CHD1, CDK4, CDKN2A, CHEK2, EPCAM (large rearrangement only), MLH1, MSH2, MSH6, MUTYH, NBN, PALB2, PMS2, PTEN, RAD51C, RAD51D, SMAD4, STK11, and TP53. Sequencing was performed for select regions of POLE and POLD1, and large rearrangement analysis was performed for select regions of GREM1. The report date is December 11, 2016.  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  HRD can be indicated by the presence of a tumor BRCA1 or BRCA2 mutation and/or genomic instability. HRD testing found genomic instability.  The report date is December 21, 2016.      01/18/2017 Adverse Reaction    Dose of Taxol is reduced due to elevated liver enzymes    05/13/2017 Imaging    CT abd/pelvis: No acute abdominal pelvic findings, masses or adenopathy. No evidence of pelvic tumor recurrence or metastatic disease.     08/12/2017 Miscellaneous    CA125 7.9   Interval history; reports left low back pain with radiation of discomfort along the lateral aspect of the left leg to the knee for the past 2 months.  Denies heavy lifting  Past Medical History:  Diagnosis Date  . Family history of breast cancer   . Migraines   . Ovarian cancer [facility])    Past Surgical History:  Procedure Laterality Date  . IR FLUORO GUIDE PORT INSERTION RIGHT  12/01/2016  . IR REMOVAL TUN ACCESS W/ PORT W/O FL MOD SED  05/19/2017  . IR US GUIDE VASC ACCESS RIGHT  12/01/2016  . TOTAL ABDOMINAL HYSTERECTOMY W/ BILATERAL SALPINGOOPHORECTOMY     ROS:  Mild numbness of the fingers, no nausea or emesis, reports weight gain, no bloating no early satiety, no vaginal or rectal bleeding.  Left low back pain with radiation down the left lateral aspect of the thigh to the knee otherwise ROS is non  contributory.  PHYSICAL EXAMINATION: LMP 08/09/2016  Wt Readings from Last 3 Encounters:  03/10/18 158 lb 1.6 oz (71.7 kg)  11/25/17 150 lb (68 kg)  08/12/17 149 lb 1.6 oz (67.6 kg)  CHEST:  CTA CV: RRR  ABD:  Soft NT, Port sites without masses,  Back: No CVA tenderness Pelvic exam: normal external female genitalia, normal vagina and normal support. Vagina without  Masses.No cul de sac nodularity. Rectal: no masses or nodularity appreciated. Extremities: No clubbing cyanosis or edema.  No nails on the lower extremity digits

## 2018-06-30 ENCOUNTER — Inpatient Hospital Stay: Payer: BLUE CROSS/BLUE SHIELD | Admitting: Gynecologic Oncology

## 2018-07-20 NOTE — Progress Notes (Signed)
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   ASSESSMENT/PLAN: Has completed 6 cycles of Taxol paclitaxel therapy, s/p carb/tax adjuvant therapy completed July, 2018.  At this time is doing well with complete clinical response.  Follow-up in 3 months  (HRD positive -candidate for PARPi) Counseled regarding signs and symptoms of recurrent disease  Left-sided flank pain with radiation to the inguinal area  No improvement with recommendations suggested .  She reports that her recently deceased brother has a history of kidney stones.   We will obtain CT of the abdomen and pelvis with and without contrast to rule out nephrolithiasis or recurrence .  Laura Washington has requested the procedure be delayed until she returns from [place]  Follow-up in 3 months   [place]  Oncology History   Laura Washington is a 53 y.o. woman who is seen in consultation at the request of Dr. Talbert Nan for evaluation of a new adnexal mass. She initially presented to her primary provider with complaint of vaginal discharge was noted to have an enlarged uterus vs pelvic mass. This prompted an ultrasound which revealed an adnexal mass and free fluid   HRD testing positive Gene panel analysis with Myriad myrisk was negative      Cancer of left fallopian tube ([place])   07/28/2016 Initial Diagnosis    She presented to PCP complaining of heavy menstruation for almost 1 year    08/27/2016 Imaging    She was referred to see Dr. Talbert Nan after she started to have vaginal discharge. Pelvic US showed: 7.4 x 6.4 x 6.6 cm solid mass in the left adnexa with irregular borders and blood flow. The Pulsatile index is <1 and the resistive index is <0.4. There is a large amount of pelvic fluid. Concerning for possible ovarian cancer.      08/27/2016 Tumor Marker    Patient's tumor was tested for the following markers: CA125. Results of the tumor marker test revealed  1577    09/09/2016 Surgery    Procedures: Bilateral - Robotic Laparoscopy, Surgical; With Bilateral Total Pelvic Lymphadenectomy Peri-Aortic Lymph Node Sample, Sgl/Mult Robotic Bilateral Salpingo-Oophorectomy W/Omentectomy, Total Abd Hysterectomy & Radical Dissection For Debulking;    Findings:  1. 9-10 cm left adnexal mass with rupture of tumor during manipulation 2. Dilated left fallopian tube removed in tact with IOFS: poorly differentiated carcinoma 3. Endometriosis implants on anterior and posterior cul-de-sac 4. Grossly normal omentum, diaphragm, small bowel mesenter 5. Grossly normal uterus and right fallopian tube/ovary 6. IOFS: anterior peritoneal biopsy (endometriosis) // left fallopian tube: poorly differentiated carcinoma 7. 1-2 cm tumor nodule in posterior cul-de-sac; completely resected 8. Non-enlarged lymph nodes 9. R0 resection at end of case 10. Ascites present    09/09/2016 Pathology Results    Pelvic fluid, washing: No definitive malignant cells identified. Blood, histiocytes, and reactive mesothelial cells present. Mixed inflammation present. See comment.    09/09/2016 Cancer Staging    Cancer Staging Cancer of left fallopian tube [facility]) Staging form: Ovary, Fallopian Tube, and Primary Peritoneal Carcinoma, AJCC 8th Edition - Pathologic stage from 09/23/2016: FIGO Stage IIA (pT2a, pN0, cM0) - Signed by Heath Lark, MD on 12/02/2016     11/30/2016 Imaging    1. Nonspecific trace fluid in the pelvic cul-de-sac. No discrete mass, adenopathy or other specific finding of local tumor recurrence in the pelvis. 2. No evidence of metastatic disease in the chest or abdomen.    12/01/2016 Procedure  Successful placement of a right IJ approach Power Port with ultrasound and fluoroscopic guidance. The catheter is ready for use    12/02/2016 Tumor Marker    Patient's tumor was tested for the following markers: CA125 Results of the tumor marker test revealed 7.8    12/04/2016 -  04/06/2017 Chemotherapy    She received carboplatin and Taxol    12/21/2016 Genetic Testing    CDKN2A (p16INK4a) c.413G>A (p.Arg138Lys) and MLH1 c.290A>G (p.Tyr97Cys) VUS found on genetic testing on the [facility] panel.  The [facility] gene panel offered by [employer] includes sequencing and deletion/duplication testing of the following 28 genes: APC, ATM, BARD1, BMPR1A, BRCA1, BRCA2, BRIP1, CHD1, CDK4, CDKN2A, CHEK2, EPCAM (large rearrangement only), MLH1, MSH2, MSH6, MUTYH, NBN, PALB2, PMS2, PTEN, RAD51C, RAD51D, SMAD4, STK11, and TP53. Sequencing was performed for select regions of POLE and POLD1, and large rearrangement analysis was performed for select regions of GREM1. The report date is December 11, 2016.  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  HRD can be indicated by the presence of a tumor BRCA1 or BRCA2 mutation and/or genomic instability. HRD testing found genomic instability.  The report date is December 21, 2016.      01/18/2017 Adverse Reaction    Dose of Taxol is reduced due to elevated liver enzymes    05/13/2017 Imaging    CT abd/pelvis: No acute abdominal pelvic findings, masses or adenopathy. No evidence of pelvic tumor recurrence or metastatic disease.     08/12/2017 Miscellaneous    CA125 7.9   Interval history; reports left low back pain with radiation of discomfort to the groin. Denies heavy lifting  Past Medical History:  Diagnosis Date  . Family history of breast cancer   . Migraines   . Ovarian cancer [facility])    Past Surgical History:  Procedure Laterality Date  . IR FLUORO GUIDE PORT INSERTION RIGHT  12/01/2016  . IR REMOVAL TUN ACCESS W/ PORT W/O FL MOD SED  05/19/2017  . IR US GUIDE VASC ACCESS RIGHT  12/01/2016  . TOTAL ABDOMINAL HYSTERECTOMY W/ BILATERAL SALPINGOOPHORECTOMY    Social history: History in law died in 07/08/2023 and her brother had a sudden cardiac death immediately following placement of a cardiac stent in [place].  Since the visit  [place] for a few months to support her mother.  ROS:  Mild numbness of the fingers, no nausea or emesis, reports weight gain, no bloating no early satiety, no vaginal or rectal bleeding.  Left low back pain with radiation down the left inguinal area.  Depression related to the sudden death of her brother earlier this week, otherwise review of systems is noncontributory  PHYSICAL EXAMINATION: BP 134/78 (BP Location: Left Arm, Patient Position: Sitting) Comment (BP Location): manual   Pulse 82   Temp 97.7 F (36.5 C) (Oral)   Resp 20   Ht '5\' 3"'  (1.6 m)   Wt 160 lb (72.6 kg)   LMP 08/09/2016   SpO2 100%   BMI 28.34 kg/m  Wt Readings from Last 3 Encounters:  07/21/18 160 lb (72.6 kg)  03/10/18 158 lb 1.6 oz (71.7 kg)  11/25/17 150 lb (68 kg)  ABD:  Soft NT, Port sites without masses,  Back: No CVA tenderness Pelvic exam: normal external female genitalia, normal vagina and normal support. Vagina without  Masses.No cul de sac nodularity. Rectal: no masses or nodularity appreciated. Extremities: No clubbing cyanosis or edema.  No nails on the lower extremity digits

## 2018-07-21 ENCOUNTER — Encounter: Payer: Self-pay | Admitting: Gynecologic Oncology

## 2018-07-21 ENCOUNTER — Inpatient Hospital Stay: Payer: Self-pay | Attending: Gynecologic Oncology | Admitting: Gynecologic Oncology

## 2018-07-21 VITALS — BP 134/78 | HR 82 | Temp 97.7°F | Resp 20 | Ht 63.0 in | Wt 160.0 lb

## 2018-07-21 DIAGNOSIS — G8929 Other chronic pain: Secondary | ICD-10-CM | POA: Insufficient documentation

## 2018-07-21 DIAGNOSIS — M545 Low back pain, unspecified: Secondary | ICD-10-CM

## 2018-07-21 DIAGNOSIS — C5702 Malignant neoplasm of left fallopian tube: Secondary | ICD-10-CM | POA: Insufficient documentation

## 2018-07-21 DIAGNOSIS — Z90710 Acquired absence of both cervix and uterus: Secondary | ICD-10-CM | POA: Insufficient documentation

## 2018-07-21 DIAGNOSIS — Z90722 Acquired absence of ovaries, bilateral: Secondary | ICD-10-CM | POA: Insufficient documentation

## 2018-07-21 DIAGNOSIS — Z9221 Personal history of antineoplastic chemotherapy: Secondary | ICD-10-CM | POA: Insufficient documentation

## 2018-07-21 DIAGNOSIS — Z923 Personal history of irradiation: Secondary | ICD-10-CM | POA: Insufficient documentation

## 2018-07-21 NOTE — Patient Instructions (Signed)
Please call the office when you return from [place] to schedule lab work for CT Scan of your abdomen and pelvis as well as the scan. You will  need to bring your new insurance card to the office so the Scan can be prior authorized

## 2018-10-21 ENCOUNTER — Telehealth: Payer: Self-pay

## 2018-10-21 NOTE — Telephone Encounter (Signed)
Following up with son to see if his mother Ms Ruttan has returned from [place] as she needs to get labs and CT scan prior to her wist with Dr. Skeet Latch as noted in progress note 07-21-18. Son [place] stated that his mother remains in [place] due to the travel ban in place due to Covid 19. Told son to call our office to set up labs, scan, and visit when she is in town and not quarantined. Will cancel 11-03-18 visit. Travel may be opened to [place] ~11-01-18. Son appreciated the call.

## 2018-11-03 ENCOUNTER — Ambulatory Visit: Payer: Self-pay | Admitting: Gynecologic Oncology

## 2018-11-17 ENCOUNTER — Telehealth: Payer: Self-pay | Admitting: *Deleted

## 2018-11-17 NOTE — Telephone Encounter (Signed)
Called and spoke with the patient's son. He stated that "they have extended the travel ban until May 25 th, so we have tickets for June 2nd." Explained to the son to call the office back after she has be quarantined for 14 days. Son verbalized understanding.

## 2018-12-28 ENCOUNTER — Telehealth: Payer: Self-pay | Admitting: *Deleted

## 2018-12-28 NOTE — Telephone Encounter (Signed)
Patient's son called back and was given the CT instructions/date/time

## 2018-12-28 NOTE — Telephone Encounter (Signed)
Called and scheduled the CT scan for 7/6. Attempted to call the son with the appt information

## 2019-01-16 ENCOUNTER — Other Ambulatory Visit: Payer: Self-pay

## 2019-01-16 ENCOUNTER — Encounter (HOSPITAL_COMMUNITY): Payer: Self-pay

## 2019-01-16 ENCOUNTER — Ambulatory Visit (HOSPITAL_COMMUNITY)
Admission: RE | Admit: 2019-01-16 | Discharge: 2019-01-16 | Disposition: A | Discharge status: Home / Self Care | Payer: 59 | Source: Ambulatory Visit | Attending: Gynecologic Oncology | Admitting: Gynecologic Oncology

## 2019-01-16 DIAGNOSIS — C5702 Malignant neoplasm of left fallopian tube: Secondary | ICD-10-CM | POA: Diagnosis not present

## 2019-01-16 MED ORDER — IOHEXOL 300 MG/ML  SOLN
100.0000 mL | Freq: Once | INTRAMUSCULAR | Status: AC | PRN
  Administered 2019-01-16: 08:00:00 100 mL via INTRAVENOUS

## 2019-01-16 MED ORDER — SODIUM CHLORIDE (PF) 0.9 % IJ SOLN
INTRAMUSCULAR | Status: AC
  Filled 2019-01-16: qty 50

## 2019-01-17 ENCOUNTER — Telehealth: Payer: Self-pay

## 2019-01-17 NOTE — Telephone Encounter (Signed)
Told son that the CT was fine no cancer seen per Joylene John, NP. Will call him back to schedule a follow up appointment with Dr. Skeet Latch in the next few weeks. Son verbalized understanding and will notify his mother of the results.

## 2019-02-06 ENCOUNTER — Telehealth: Payer: Self-pay | Admitting: *Deleted

## 2019-02-06 NOTE — Telephone Encounter (Signed)
Called and spoke with the son, scheduled an appt for 8/20

## 2019-03-01 NOTE — Progress Notes (Signed)
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   ASSESSMENT/PLAN: Has completed 6 cycles of Taxol paclitaxel therapy, s/p carb/tax adjuvant therapy completed July, 2018.  At this time is doing well with complete clinical response.  Follow-up in 3 months  (HRD positive -candidate for PARPi) Counseled regarding signs and symptoms of recurrent disease  Left sciatica Imaging without evidence of recurrence 7/202 F/U in 3 months at [facility] Follow-up in 3 months   HISTORY OF PRESENT ILLNESS  Oncology History Overview Note  Laura Washington is a 53 y.o. woman who is seen in consultation at the request of Dr. Talbert Nan for evaluation of a new adnexal mass. She initially presented to her primary provider with complaint of vaginal discharge was noted to have an enlarged uterus vs pelvic mass. This prompted an ultrasound which revealed an adnexal mass and free fluid   HRD testing positive Gene panel analysis with Myriad myrisk was negative    Cancer of left fallopian tube ([place])  07/28/2016 Initial Diagnosis   She presented to PCP complaining of heavy menstruation for almost 1 year   08/27/2016 Imaging   She was referred to see Dr. Talbert Nan after she started to have vaginal discharge. Pelvic US showed: 7.4 x 6.4 x 6.6 cm solid mass in the left adnexa with irregular borders and blood flow. The Pulsatile index is <1 and the resistive index is <0.4. There is a large amount of pelvic fluid. Concerning for possible ovarian cancer.     08/27/2016 Tumor Marker   Patient's tumor was tested for the following markers: CA125. Results of the tumor marker test revealed 1577   09/09/2016 Surgery   Procedures: Bilateral - Robotic Laparoscopy, Surgical; With Bilateral Total Pelvic Lymphadenectomy Peri-Aortic Lymph Node Sample, Sgl/Mult Robotic Bilateral Salpingo-Oophorectomy W/Omentectomy, Total Abd Hysterectomy & Radical Dissection For Debulking;    Findings:  1. 9-10  cm left adnexal mass with rupture of tumor during manipulation 2. Dilated left fallopian tube removed in tact with IOFS: poorly differentiated carcinoma 3. Endometriosis implants on anterior and posterior cul-de-sac 4. Grossly normal omentum, diaphragm, small bowel mesenter 5. Grossly normal uterus and right fallopian tube/ovary 6. IOFS: anterior peritoneal biopsy (endometriosis) // left fallopian tube: poorly differentiated carcinoma 7. 1-2 cm tumor nodule in posterior cul-de-sac; completely resected 8. Non-enlarged lymph nodes 9. R0 resection at end of case 10. Ascites present   09/09/2016 Pathology Results   Pelvic fluid, washing: No definitive malignant cells identified. Blood, histiocytes, and reactive mesothelial cells present. Mixed inflammation present. See comment.   09/09/2016 Cancer Staging   Cancer Staging Cancer of left fallopian tube [facility]) Staging form: Ovary, Fallopian Tube, and Primary Peritoneal Carcinoma, AJCC 8th Edition - Pathologic stage from 09/23/2016: FIGO Stage IIA (pT2a, pN0, cM0) - Signed by Heath Lark, MD on 12/02/2016    11/30/2016 Imaging   1. Nonspecific trace fluid in the pelvic cul-de-sac. No discrete mass, adenopathy or other specific finding of local tumor recurrence in the pelvis. 2. No evidence of metastatic disease in the chest or abdomen.   12/01/2016 Procedure   Successful placement of a right IJ approach Power Port with ultrasound and fluoroscopic guidance. The catheter is ready for use   12/02/2016 Tumor Marker   Patient's tumor was tested for the following markers: CA125 Results of the tumor marker test revealed 7.8   12/04/2016 - 04/06/2017 Chemotherapy   She received carboplatin and Taxol   12/21/2016 Genetic Testing   CDKN2A (p16INK4a) c.413G>A (p.Arg138Lys) and  MLH1 c.290A>G (p.Tyr97Cys) VUS found on genetic testing on the [facility] panel.  The [facility] gene panel offered by [employer] includes sequencing and  deletion/duplication testing of the following 28 genes: APC, ATM, BARD1, BMPR1A, BRCA1, BRCA2, BRIP1, CHD1, CDK4, CDKN2A, CHEK2, EPCAM (large rearrangement only), MLH1, MSH2, MSH6, MUTYH, NBN, PALB2, PMS2, PTEN, RAD51C, RAD51D, SMAD4, STK11, and TP53. Sequencing was performed for select regions of POLE and POLD1, and large rearrangement analysis was performed for select regions of GREM1. The report date is December 11, 2016.  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  HRD can be indicated by the presence of a tumor BRCA1 or BRCA2 mutation and/or genomic instability. HRD testing found genomic instability.  The report date is December 21, 2016.     01/18/2017 Adverse Reaction   Dose of Taxol is reduced due to elevated liver enzymes   05/13/2017 Imaging   CT abd/pelvis: No acute abdominal pelvic findings, masses or adenopathy. No evidence of pelvic tumor recurrence or metastatic disease.    08/12/2017 Miscellaneous   CA125 7.9   Interval history; reports left low back pain with radiation of discomfort to the groin. Denies heavy lifting  Past Medical History:  Diagnosis Date  . Family history of breast cancer   . Migraines   . Ovarian cancer [facility])    Past Surgical History:  Procedure Laterality Date  . IR FLUORO GUIDE PORT INSERTION RIGHT  12/01/2016  . IR REMOVAL TUN ACCESS W/ PORT W/O FL MOD SED  05/19/2017  . IR US GUIDE VASC ACCESS RIGHT  12/01/2016  . TOTAL ABDOMINAL HYSTERECTOMY W/ BILATERAL SALPINGOOPHORECTOMY    Social history: History in law died in 06/30/2023 and her brother had a sudden cardiac death immediately following placement of a cardiac stent in [place].  Since the visit [place] for a few months to support her mother.  ROS:   Review of Systems  Constitutional: Negative.   HENT: Negative.   Respiratory: Negative.   Cardiovascular: Negative.   Gastrointestinal:       Weight gain  Musculoskeletal: Positive for back pain.       Left sided sciatica  Neurological: Negative for  tingling.       Residual neuropathy in the toes  Psychiatric/Behavioral: Negative.     PHYSICAL EXAMINATION: LMP 08/09/2016  Wt Readings from Last 3 Encounters:  03/02/19 158 lb 12.8 oz (72 kg)  07/21/18 160 lb (72.6 kg)  03/10/18 158 lb 1.6 oz (71.7 kg)  Physical Exam  Constitutional: She appears well-developed. No distress.  Eyes: No scleral icterus.  Neck: Normal range of motion. Neck supple. No JVD present. No tracheal deviation present.  Cardiovascular: Normal rate. Exam reveals no friction rub.  No murmur heard. Respiratory: Effort normal. No respiratory distress. She has no rales. She exhibits no tenderness.  GI: Soft. She exhibits no distension and no mass. There is no abdominal tenderness. There is no rebound and no guarding.  Genitourinary:    Vagina normal.     No vaginal discharge.   Musculoskeletal:        General: No tenderness, deformity or edema.  Neurological: She is alert. No cranial nerve deficit.  Skin: Skin is warm and dry. No rash noted. No erythema.  Psychiatric: She has a normal mood and affect.

## 2019-03-02 ENCOUNTER — Inpatient Hospital Stay: Payer: 59 | Attending: Gynecologic Oncology | Admitting: Gynecologic Oncology

## 2019-03-02 ENCOUNTER — Other Ambulatory Visit: Payer: Self-pay

## 2019-03-02 VITALS — BP 128/64 | HR 108 | Temp 98.7°F | Resp 18 | Ht 63.0 in | Wt 158.8 lb

## 2019-03-02 DIAGNOSIS — Z9221 Personal history of antineoplastic chemotherapy: Secondary | ICD-10-CM | POA: Diagnosis not present

## 2019-03-02 DIAGNOSIS — M5432 Sciatica, left side: Secondary | ICD-10-CM | POA: Diagnosis not present

## 2019-03-02 DIAGNOSIS — G620 Drug-induced polyneuropathy: Secondary | ICD-10-CM

## 2019-03-02 DIAGNOSIS — Z90710 Acquired absence of both cervix and uterus: Secondary | ICD-10-CM | POA: Insufficient documentation

## 2019-03-02 DIAGNOSIS — Z90722 Acquired absence of ovaries, bilateral: Secondary | ICD-10-CM | POA: Diagnosis not present

## 2019-03-02 DIAGNOSIS — C5702 Malignant neoplasm of left fallopian tube: Secondary | ICD-10-CM | POA: Diagnosis present

## 2019-03-02 NOTE — Patient Instructions (Signed)
Follow-up at [facility] in 3 months  Thank you very much Ms. Cathlean Cower for allowing me to provide care for you today.  I appreciate your confidence in choosing our Gynecologic Oncology team.  If you have any questions about your visit today please call our office and we will get back to you as soon as possible.  Please consider using the website Medlineplus.gov as an Geneticist, molecular.   Francetta Found. Mouhamad Teed MD., PhD Gynecologic Oncology

## 2019-03-07 ENCOUNTER — Telehealth: Payer: Self-pay | Admitting: *Deleted

## 2019-03-07 NOTE — Telephone Encounter (Signed)
Called and left the patient's daughter a message to call the office. Patient is scheduled for a follow up appt on 11/17 at 11:45am with Dr. Skeet Latch at [facility]
